# Patient Record
Sex: Female | Born: 1990 | Race: Black or African American | Hispanic: No | Marital: Single | Smoking: Never smoker
Health system: Southern US, Community
[De-identification: ages and names within clinical notes are randomized; demographics above are authoritative.]

## PROBLEM LIST (undated history)

## (undated) DIAGNOSIS — Z8619 Personal history of other infectious and parasitic diseases: Secondary | ICD-10-CM

## (undated) DIAGNOSIS — F329 Major depressive disorder, single episode, unspecified: Secondary | ICD-10-CM

## (undated) DIAGNOSIS — M419 Scoliosis, unspecified: Secondary | ICD-10-CM

## (undated) DIAGNOSIS — F32A Depression, unspecified: Secondary | ICD-10-CM

## (undated) DIAGNOSIS — E0590 Thyrotoxicosis, unspecified without thyrotoxic crisis or storm: Secondary | ICD-10-CM

## (undated) DIAGNOSIS — F419 Anxiety disorder, unspecified: Secondary | ICD-10-CM

## (undated) HISTORY — DX: Anxiety disorder, unspecified: F41.9

## (undated) HISTORY — DX: Major depressive disorder, single episode, unspecified: F32.9

## (undated) HISTORY — DX: Scoliosis, unspecified: M41.9

## (undated) HISTORY — DX: Depression, unspecified: F32.A

## (undated) HISTORY — DX: Personal history of other infectious and parasitic diseases: Z86.19

---
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
## 2009-07-07 ENCOUNTER — Ambulatory Visit: Payer: Self-pay | Admitting: Internal Medicine

## 2009-07-07 DIAGNOSIS — F4321 Adjustment disorder with depressed mood: Secondary | ICD-10-CM | POA: Insufficient documentation

## 2009-07-07 DIAGNOSIS — M4120 Other idiopathic scoliosis, site unspecified: Secondary | ICD-10-CM | POA: Insufficient documentation

## 2009-07-07 DIAGNOSIS — J45909 Unspecified asthma, uncomplicated: Secondary | ICD-10-CM | POA: Insufficient documentation

## 2009-10-27 ENCOUNTER — Ambulatory Visit: Payer: Self-pay | Admitting: Licensed Clinical Social Worker

## 2009-11-05 ENCOUNTER — Ambulatory Visit: Payer: Self-pay | Admitting: Licensed Clinical Social Worker

## 2009-11-05 ENCOUNTER — Ambulatory Visit: Payer: Self-pay | Admitting: Internal Medicine

## 2009-11-05 LAB — CONVERTED CEMR LAB
AST: 21 units/L (ref 0–37)
Albumin: 4.3 g/dL (ref 3.5–5.2)
BUN: 7 mg/dL (ref 6–23)
Basophils Absolute: 0 10*3/uL (ref 0.0–0.1)
Bilirubin Urine: NEGATIVE
CO2: 27 meq/L (ref 19–32)
Chloride: 108 meq/L (ref 96–112)
Cholesterol: 178 mg/dL (ref 0–200)
Eosinophils Absolute: 0.4 10*3/uL (ref 0.0–0.7)
Glucose, Bld: 80 mg/dL (ref 70–99)
Glucose, Urine, Semiquant: NEGATIVE
HCT: 35.8 % — ABNORMAL LOW (ref 36.0–46.0)
Hemoglobin: 11.8 g/dL — ABNORMAL LOW (ref 12.0–15.0)
Lymphs Abs: 2 10*3/uL (ref 0.7–4.0)
MCHC: 32.9 g/dL (ref 30.0–36.0)
MCV: 88.6 fL (ref 78.0–100.0)
Monocytes Absolute: 0.5 10*3/uL (ref 0.1–1.0)
Monocytes Relative: 10.6 % (ref 3.0–12.0)
Neutro Abs: 2 10*3/uL (ref 1.4–7.7)
Platelets: 270 10*3/uL (ref 150.0–400.0)
Potassium: 3.2 meq/L — ABNORMAL LOW (ref 3.5–5.1)
Protein, U semiquant: NEGATIVE
RDW: 13.4 % (ref 11.5–14.6)
Sodium: 139 meq/L (ref 135–145)
Specific Gravity, Urine: 1.025
TSH: 2.32 microintl units/mL (ref 0.35–5.50)
Total Bilirubin: 0.4 mg/dL (ref 0.3–1.2)
VLDL: 13.2 mg/dL (ref 0.0–40.0)
WBC Urine, dipstick: NEGATIVE
pH: 6

## 2009-11-11 ENCOUNTER — Ambulatory Visit: Payer: Self-pay | Admitting: Internal Medicine

## 2009-11-11 DIAGNOSIS — D649 Anemia, unspecified: Secondary | ICD-10-CM

## 2009-11-11 DIAGNOSIS — F4323 Adjustment disorder with mixed anxiety and depressed mood: Secondary | ICD-10-CM

## 2009-11-11 DIAGNOSIS — E876 Hypokalemia: Secondary | ICD-10-CM

## 2009-11-12 ENCOUNTER — Ambulatory Visit: Payer: Self-pay | Admitting: Licensed Clinical Social Worker

## 2009-12-10 ENCOUNTER — Ambulatory Visit: Payer: Self-pay | Admitting: Internal Medicine

## 2009-12-10 DIAGNOSIS — K5289 Other specified noninfective gastroenteritis and colitis: Secondary | ICD-10-CM | POA: Insufficient documentation

## 2009-12-11 LAB — CONVERTED CEMR LAB
Basophils Relative: 1.2 % (ref 0.0–3.0)
Chloride: 108 meq/L (ref 96–112)
Creatinine, Ser: 0.8 mg/dL (ref 0.4–1.2)
Eosinophils Relative: 7 % — ABNORMAL HIGH (ref 0.0–5.0)
Folate: >20 ng/mL
Hemoglobin: 12.4 g/dL (ref 12.0–15.0)
Iron: 102 ug/dL (ref 42–145)
Lymphocytes Relative: 31.5 % (ref 12.0–46.0)
MCV: 87.8 fL (ref 78.0–100.0)
Neutro Abs: 2.3 10*3/uL (ref 1.4–7.7)
Neutrophils Relative %: 49.9 % (ref 43.0–77.0)
RBC: 4.26 M/uL (ref 3.87–5.11)
Sodium: 142 meq/L (ref 135–145)
Transferrin: 247.4 mg/dL (ref 212.0–360.0)
Vitamin B-12: 404 pg/mL (ref 211–911)
WBC: 4.6 10*3/uL (ref 4.5–10.5)

## 2009-12-31 ENCOUNTER — Ambulatory Visit: Payer: Self-pay | Admitting: Licensed Clinical Social Worker

## 2010-01-01 ENCOUNTER — Ambulatory Visit: Payer: Self-pay | Admitting: Internal Medicine

## 2010-02-02 ENCOUNTER — Ambulatory Visit: Payer: Self-pay | Admitting: Internal Medicine

## 2010-03-11 ENCOUNTER — Ambulatory Visit: Payer: Self-pay | Admitting: Internal Medicine

## 2010-07-27 ENCOUNTER — Ambulatory Visit: Payer: Self-pay | Admitting: Licensed Clinical Social Worker

## 2010-08-02 ENCOUNTER — Encounter: Payer: Self-pay | Admitting: Internal Medicine

## 2010-10-05 NOTE — Assessment & Plan Note (Signed)
Summary: 5 wk rov/mm   Vital Signs:  Patient profile:   20 year old female Menstrual status:  regular Weight:      123 pounds BMI:     21.69 BP sitting:   92 / 62  (left arm) Cuff size:   regular  Vitals Entered By: Raechel Ache, RN (March 11, 2010 10:38 AM) CC: F/u on meds.   History of Present Illness: Bethany Jefferson  comes in today  for follow up of medsications.  Since last visit no change in  health status .No recent panic attacks .    Traveled to [place]  recently and  in comfort better.   Decided to not try the benzo because read about it and doesnt want to try it.  Still takign lexapro .  Still seeing  counselor but missed last appt because was out of town but actually some better.    Unsure how she will do  when  school starts.   Preventive Screening-Counseling & Management  Alcohol-Tobacco     Alcohol drinks/day: 0     Smoking Status: never  Allergies: No Known Drug Allergies  Past History:  Past medical, surgical, family and social histories (including risk factors) reviewed for relevance to current acute and chronic problems.  Past Medical History: Reviewed history from 02/02/2010 and no changes required. asthma as a child    quiescent.  Had varicella  disease  Spinal surgery fusion/ rod scoliosis  Past Surgical History: Reviewed history from 07/07/2009 and no changes required. Spinal Fusion 2002-Dr. [facility].  Family History: Reviewed history from 07/07/2009 and no changes required. Father: High Cholesterol, Graves Mother: Genella Rife Siblings: Sister-Crohn's   Social History: Reviewed history from 11/11/2009 and no changes required. Single  hh of 4  Never Smoked Alcohol use-no Regular exercise-no Did one year of [employer] . [place] since summer.   [employer]   taking 2 courses so far  Mom a nurse at [employer] No current partners  Review of Systems  The patient denies anorexia, fever, chest pain, syncope, difficulty walking, and  angioedema.    Physical Exam  General:  alert, well-developed, and well-nourished.   Neck:  No deformities, masses, or tenderness noted. Lungs:  normal respiratory effort and no intercostal retractions.   Psych:  Oriented X3, good eye contact, and not depressed appearing.  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   Problem # 3:  ANEMIA, MILD (ICD-285.9) declined hg check today and had been better  so ok to stick with MVI   Complete Medication List: 1)  Lexapro 20 Mg Tabs (Escitalopram oxalate) .Marland Kitchen.. 1 by mouth once daily 2)  Multivitamins Tabs (Multiple vitamin)  Patient Instructions: 1)  Get appt with [name]   2)  for now stay on the lexapro same dose  3)  Consider  changing to a medication like effexor for your anxiety.  4)  ROV  in one month or after seeing [name].  5)  Continue on vvitamins iron,

## 2010-10-05 NOTE — Assessment & Plan Note (Signed)
Summary: 3 wks rov/mm   Vital Signs:  Patient profile:   20 year old female Menstrual status:  regular LMP:     12/14/2009 Weight:      119 pounds Pulse rate:   60 / minute BP sitting:   100 / 60  (left arm) Cuff size:   regular  Vitals Entered By: Romualdo Bolk, CMA Duncan Dull) (January 01, 2010 2:03 PM) CC: Follow-up visit- Pt states that she just started a MVI with Iron on 4/28. LMP (date): 12/14/2009 LMP - Character: normal Menarche (age onset years): 10   Menses interval (days): 28 Menstrual flow (days): 5-6 Enter LMP: 12/14/2009   History of Present Illness: Bethany Jefferson comesin comes in today  for  above  for follow up of a few issues  Moods and anxiety: has seen [name] who siad she may have been having panic attacks . With increase to 15 mg of lexapro   anxiety some  better .   taking at night and makes her tired.    ha next counseling s appt    In [place]y 11  .    has uri also for about a week  congestion no fever. just started taking MVi  .   Preventive Screening-Counseling & Management  Alcohol-Tobacco     Alcohol drinks/day: 0     Smoking Status: never  Caffeine-Diet-Exercise     Caffeine use/day: less than 1     Does Patient Exercise: no  Current Medications (verified): 1)  Lexapro 10 Mg Tabs (Escitalopram Oxalate) .Marland Kitchen.. 1 and 1/2  By Mouth Once Daily 2)  Mvi With Fe  Allergies (verified): No Known Drug Allergies  Past History:  Past medical, surgical, family and social histories (including risk factors) reviewed for relevance to current acute and chronic problems.  Past Medical History: Reviewed history from 07/07/2009 and no changes required. asthma as a child    quiescent.  Had varicella  disease   Past Surgical History: Reviewed history from 07/07/2009 and no changes required. Spinal Fusion 2002-Dr. [facility]  Past History:  Care Management: Psychology  MSW: [name]- Counselor  Family History: Reviewed history  from 07/07/2009 and no changes required. Father: High Cholesterol, Graves Mother: Genella Rife Siblings: Sister-Crohn's   Social History: Reviewed history from 11/11/2009 and no changes required. Single  hh of 4  Never Smoked Alcohol use-no Regular exercise-no Did one year of [employer] . [place] since summer.   [employer]   taking 2 courses so far  Mom a nurse at [employer] No current partners  Review of Systems  The patient denies anorexia, chest pain, syncope, dyspnea on exertion, and enlarged lymph nodes.    Physical Exam  General:  alert, well-developed, well-nourished, and well-hydrated.   Nose:  mild congestion.  Psych:  Oriented X3, good eye contact, not depressed appearing, and slightly anxious.     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    rec  trial increase dose and add benzo as needed  Discussed risk benefit  .    Problem # 2:  HYPOKALEMIA, MILD (ICD-276.8) Assessment: Improved  Problem # 3:  ANEMIA, MILD (ICD-285.9) Assessment: Improved take MVI   Complete Medication List: 1)  Lexapro 10 Mg Tabs (Escitalopram oxalate) .... Take 2 by mouth once daily 2)  Mvi With Fe  3)  Alprazolam 0.25 Mg Tabs (Alprazolam) .Marland Kitchen.. 1 by mouth three  times a day as needed  panic attack 4)  Lexapro 20 Mg Tabs (Escitalopram oxalate) .Marland Kitchen.. 1 by mouth once daily  Patient Instructions: 1)  increase  lexapro to 20 mg   ( 2  10 mg per day)     2)  Can use xanax  as needed for panic attack 3)  Multivitamin with iron  for now Your labs  are better .  4)  return office visit in 1 month or as needed. Prescriptions: LEXAPRO 20 MG TABS (ESCITALOPRAM OXALATE) 1 by mouth once daily  #30 x 2   Entered and Authorized by:   Madelin Headings MD   Signed by:   Madelin Headings MD on 01/01/2010   Method used:   Print then Give to Patient   RxID:   [id number] ALPRAZOLAM 0.25 MG TABS (ALPRAZOLAM) 1 by mouth  three times a day as needed  panic attack  #24 x o   Entered and Authorized by:   Madelin Headings MD   Signed by:   Madelin Headings MD on 01/01/2010   Method used:   Print then Give to Patient   RxID:   [id number]

## 2010-10-05 NOTE — Assessment & Plan Note (Signed)
Summary: cpx/cjr [facility] BMP/NJR   Vital Signs:  Patient profile:   20 year old female Menstrual status:  regular LMP:     11/05/2009 Height:      63.25 inches Weight:      119 pounds Pulse rate:   66 / minute BP sitting:   100 / 60  (right arm) Cuff size:   regular  Vitals Entered By: Romualdo Bolk, CMA (AAMA) (November 11, 2009 1:02 PM) CC: CPX LMP (date): 11/05/2009 LMP - Character: normal Menarche (age onset years): 10   Menses interval (days): 28 Menstrual flow (days): 5-6 Enter LMP: 11/05/2009   History of Present Illness: Bethany Jefferson comesin today for   preventive visit . Since last visit no change in health but says deprssion and anxiety still present.  Recently began counseling seeing susan bond x 2 so far.  ? if med would help . NO suicidal but has though about it.   going to school [employer] 2 classes .  living at home. Some anxiety that keeps her from  meeting new people.  No panic attacks .  Sleeps poorly before some  things she has to do.  No bleeding except normal periods sometimes heavy.  no blood donation.  on no diuretics or supplements.     Preventive Care Screening  Prior Values:    Last Tetanus Booster:  Historical (09/06/2007)   Preventive Screening-Counseling & Management  Alcohol-Tobacco     Alcohol drinks/day: 0     Smoking Status: never  Caffeine-Diet-Exercise     Caffeine use/day: less than 1     Does Patient Exercise: no  Hep-HIV-STD-Contraception     Dental Visit-last 6 months yes  Safety-Violence-Falls     Seat Belt Use: yes     Firearms in the Home: no firearms in the home     Smoke Detectors: yes  Current Medications (verified): 1)  None  Allergies (verified): No Known Drug Allergies  Past History:  Past medical, surgical, family and social histories (including risk factors) reviewed, and no changes noted (except as noted below).  Past Medical History: Reviewed history from 07/07/2009 and no changes required. asthma as a  child    quiescent.  Had varicella  disease   Past Surgical History: Reviewed history from 07/07/2009 and no changes required. Spinal Fusion 2002-Dr. [facility].  Past History:  Care Management: Psychology  MSW: [name]- Counselor  Family History: Reviewed history from 07/07/2009 and no changes required. Father: High Cholesterol, Graves Mother: Genella Rife Siblings: Sister-Crohn's   Social History: Reviewed history from 07/07/2009 and no changes required. Single  hh of 4  Never Smoked Alcohol use-no Regular exercise-no Did one year of [employer] . [place] since summer.   [employer]   taking 2 courses so far  Mom a nurse at [employer] No current partners  Dental Care w/in 6 mos.:  yes  Review of Systems  The patient denies anorexia, fever, weight loss, vision loss, decreased hearing, hoarseness, chest pain, syncope, dyspnea on exertion, peripheral edema, prolonged cough, headaches, hemoptysis, abdominal pain, melena, hematochezia, severe indigestion/heartburn, hematuria, incontinence, genital sores, muscle weakness, suspicious skin lesions, transient blindness, difficulty walking, unusual weight change, abnormal bleeding, enlarged lymph nodes, angioedema, and breast masses.   Physical Exam General Appearance: well developed, well nourished, no acute distress Eyes: conjunctiva and lids normal, PERRLA, EOMI, WNL Ears, Nose, Mouth, Throat: TM clear, nares clear, oral exam WNL Neck: supple, no lymphadenopathy, no thyromegaly, no JVD Respiratory: clear to  auscultation and percussion, respiratory effort normal Cardiovascular: regular rate and rhythm, S1-S2, no murmur, rub or gallop, no bruits, peripheral pulses normal and symmetric, no cyanosis, clubbing, edema or varicosities Chest: no scars, masses, tenderness; no asymmetry, skin changes, nipple discharge   Gastrointestinal: soft, non-tender; no hepatosplenomegaly, masses; active bowel sounds all quadrants,  Genitourinary:  nl exsternal Lymphatic: no cervical, axillary or inguinal adenopathy Musculoskeletal: gait normal, muscle tone and strength WNL, no joint swelling, effusions, discoloration, crepitus well healed scar from scoliosis surgery.  Skin: clear, good turgor, color WNL, no rashes, lesions, or ulcerations Neurologic: normal mental status, normal reflexes, normal strength, sensation, and motion Psychiatric: alert; oriented to person, place and time Other Exam:  labs nl except K 3.2 and hg 11.8   UA was on menses     Impression & Recommendations:  Problem # 1:  HEALTH MAINTENANCE EXAM, ADULT (ICD-V70.0) counseled   . healthy lifestyle .  labs good except mild anemia .  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 in counseling  dis adding meds    and beagan lexapro today with samples and will have follow up appts.  Discussed risk benefit    and nuisuance se .  Problem # 3:  ANEMIA, MILD (ICD-285.9) prob iron related and can add vitamins and follow   Problem # 4:  HYPOKALEMIA, MILD (ICD-276.8) ? if lab issue  no other obv reason for this   consider recheck at follow up . HO on K foods   Complete Medication List: 1)  Lexapro 10 Mg Tabs (Escitalopram oxalate) .... Take 1/2 by mouth once daily for 1 week then increase to 1 by mouth once daily  Patient Instructions: 1)  Iincrease  potassium rich foods and avoid salt and sodium foods. 2)  Take Multivitamin with iron   to help   with your borderline anemia. 3)  Begin Lexapro 5 mg per day  for a week and then increase to 10 mg per day 4)  Return office visit in 3 weeks or so . 5)  Call in meantime with ?s  or concerns.

## 2010-10-05 NOTE — Medication Information (Signed)
Summary: Drug Utilization Review for Lexapro  Drug Utilization Review for Lexapro   Imported By: Maryln Gottron 08/06/2010 13:29:50  _____________________________________________________________________  External Attachment:    Type:   Image     Comment:   External Document

## 2010-10-05 NOTE — Assessment & Plan Note (Signed)
Summary: 1 mo rov/mm   Vital Signs:  Patient profile:   20 year old female Menstrual status:  regular LMP:     01/03/2010 Weight:      120 pounds Pulse rate:   78 / minute Pulse (ortho):   72 / minute BP sitting:   100 / 70  (left arm) BP standing:   102 / 62 Cuff size:   regular  Vitals Entered By: Romualdo Bolk, CMA (AAMA) (Feb 02, 2010 1:52 PM)  Serial Vital Signs/Assessments:  Time      Position  BP       Pulse  Resp  Temp     By 2:03 PM   Lying RA  110/60   78                    Shannon S Cranford, CMA (AAMA) 2:03 PM   Sitting   100/60   80                    Shannon S Cranford, CMA (AAMA) 2:03 PM   Standing  102/62   72                    Shannon S Cranford, CMA (AAMA)  CC: follow-up visit- Pt is still taking a regular mvI no iron at this time. Pt is taking 2 of the 10mg  of lexapro and it is working okay. Pt states that she felt lightheaded on 5/27 and she was also vomiting. Pt states that she looked online about this and discuss this with some people. She thinks it due to the lexapro. LMP (date): 01/03/2010 LMP - Character: normal Menarche (age onset years): 10   Menses interval (days): 28 Menstrual flow (days): 5-6 Enter LMP: 01/03/2010   History of Present Illness: Bethany Jefferson comes in today   for follow up of meds and anxiety  has been  on 20 mg of lexapro and No panic attacks   decided not to get the benzo filled   ..   some days  is ok with anxiety  and others  very worried.    some  good days.     Seeing  Darl Pikes and   missed appt ..    because of   car trouble.   5 days ago had onset of dizziness nausea and vomiting  without fever or abd pain ha    . Currently is now fine with this.  ? about  prolylaxis with dental work with her back surgery. had been on this in the past but unsure if needs this now. No hx of infection. NO currrent ortho. Released from care ( [place])  No bleeding.    Preventive Screening-Counseling & Management  Alcohol-Tobacco   Alcohol drinks/day: 0     Smoking Status: never  Caffeine-Diet-Exercise     Caffeine use/day: less than 1     Does Patient Exercise: no  Current Medications (verified): 1)  Alprazolam 0.25 Mg Tabs (Alprazolam) .Marland Kitchen.. 1 By Mouth Three Times A Day As Needed  Panic Attack 2)  Lexapro 20 Mg Tabs (Escitalopram Oxalate) .Marland Kitchen.. 1 By Mouth Once Daily 3)  Multivitamins   Tabs (Multiple Vitamin)  Allergies (verified): No Known Drug Allergies  Past History:  Past medical, surgical, family and social histories (including risk factors) reviewed, and no changes noted (except as noted below).  Past Medical History: asthma as a child    quiescent.  Had varicella  disease  Spinal surgery fusion/ rod scoliosis  Past Surgical History: Reviewed history from 07/07/2009 and no changes required. Spinal Fusion 2002-[place]. [facility]  Past History:  Care Management: Psychology  MSW: [name]- Counselor  Family History: Reviewed history from 07/07/2009 and no changes required. Father: High Cholesterol, Graves Mother: Genella Rife Siblings: Sister-Crohn's   Social History: Reviewed history from 11/11/2009 and no changes required. Single  hh of 4  Never Smoked Alcohol use-no Regular exercise-no Did one year of [employer] . [place].   [employer]   taking 2 courses so far  Mom a nurse at [employer] No current partners  Review of Systems  The patient denies anorexia, fever, weight loss, weight gain, vision loss, decreased hearing, prolonged cough, abdominal pain, muscle weakness, transient blindness, difficulty walking, abnormal bleeding, enlarged lymph nodes, and angioedema.    Physical Exam  General:  Well-developed,well-nourished,in no acute distress; alert,appropriate and cooperative throughout examination Head:  normocephalic and atraumatic.   Eyes:  vision grossly intact, pupils equal, and pupils round.  glasses  Ears:  R ear normal and L ear normal.  no external  deformities.   Nose:  no external deformity and no external erythema.   Mouth:  pharynx pink and moist.   Neck:  No deformities, masses, or tenderness noted. Lungs:  Normal respiratory effort, chest expands symmetrically. Lungs are clear to auscultation, no crackles or wheezes. Heart:  Normal rate and regular rhythm. S1 and S2 normal without gallop, murmur, click, rub or other extra sounds. Abdomen:  Bowel sounds positive,abdomen soft and non-tender without masses, organomegaly or  noted. Pulses:  pulses intact without delay   Extremities:  no clubbing cyanosis or edema  Neurologic:  non focal  Skin:  turgor normal, color normal, no ecchymoses, and no petechiae.   Cervical Nodes:  No lymphadenopathy noted Psych:  Oriented X3, good eye contact, and not depressed appearing.  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 will follow and have her continue counseling.  Problem # 2:  DIZZINESS EPISODE (ICD-780.4) sound like an acute episode and not related to med as she was on this for at least a week before onset and now is better. nl exam and va.  will folllow and recheck if recurrs.   Problem # 3:  SCOLIOSIS (ICD-737.30) Assessment: Comment Only  Problem # 4:  ANEMIA, MILD (ICD-285.9) better  last time consider rechecka t follow up   Complete Medication List: 1)  Alprazolam 0.25 Mg Tabs (Alprazolam) .Marland Kitchen.. 1 by mouth three times a day as needed  panic attack 2)  Lexapro 20 Mg Tabs (Escitalopram oxalate) .Marland Kitchen.. 1 by mouth once daily 3)  Multivitamins Tabs (Multiple vitamin)  Patient Instructions: 1)  no need for antibioitc for routine cleaning . 2)  if something more invasive ask again  about the antibioitc prophylaxis. 3)  I dont think the lexapro caused the dizzy vomiting episode  and stay on 20 mg for now    and get another appt with [name].  4)  return office visit in 4-5 weeks .

## 2010-10-05 NOTE — Assessment & Plan Note (Signed)
Summary: 3 WK ROV // RS   Vital Signs:  Patient profile:   20 year old female Menstrual status:  regular LMP:     12/01/2009 Weight:      116 pounds Pulse rate:   66 / minute BP sitting:   100 / 60  (right arm) Cuff size:   regular  Vitals Entered By: Bethany Jefferson, CMA (AAMA) (December 10, 2009 10:23 AM) CC: follow-up visit LMP (date): 12/01/2009 LMP - Character: normal Menarche (age onset years): 10   Menses interval (days): 28 Menstrual flow (days): 5-6 Enter LMP: 12/01/2009   History of Present Illness: Bethany Jefferson comesin for follow up of her med  for depression and anxiety . She istaking 1 by mouth once daily  of lexapro.  and   cant  tell a difference but sleep is better.    Hasnt seen Bethany Jefferson in a while. " needs to make an appt"  Has had a stomach bug for a day as in family with abd pain and diarrhea x 1 day but no fever and getting better and no vomiting.  No muscle cramps  noexcess bleeding or bruising.  Preventive Screening-Counseling & Management  Alcohol-Tobacco     Alcohol drinks/day: 0     Smoking Status: never  Caffeine-Diet-Exercise     Caffeine use/day: less than 1     Does Patient Exercise: no  Current Medications (verified): 1)  Lexapro 10 Mg Tabs (Escitalopram Oxalate) .Marland Kitchen.. 1 By Mouth Once Daily  Allergies (verified): No Known Drug Allergies  Past History:  Past medical, surgical, family and social histories (including risk factors) reviewed, and no changes noted (except as noted below).  Past Medical History: Reviewed history from 07/07/2009 and no changes required. asthma as a child    quiescent.  Had varicella  disease   Past Surgical History: Reviewed history from 07/07/2009 and no changes required. Spinal Fusion 2002-Dr. [facility].  Past History:  Care Management: Psychology  MSW: Bethany Jefferson- Counselor  Family History: Reviewed history from 07/07/2009 and no changes required. Father: High Cholesterol,  Graves Mother: Bethany Jefferson Siblings: Sister-Crohn's   Social History: Reviewed history from 11/11/2009 and no changes required. Single  hh of 4  Never Smoked Alcohol use-no Regular exercise-no Did one year of [employer] . [place].   [employer]   taking 2 courses so far  Mom a nurse at [employer] No current partners  Review of Systems       The patient complains of anorexia.  The patient denies fever, weight gain, chest pain, syncope, melena, hematochezia, severe indigestion/heartburn, hematuria, incontinence, difficulty walking, unusual weight change, abnormal bleeding, enlarged lymph nodes, and angioedema.    Physical Exam  General:  alert, well-developed, and well-nourished.  looks uncomfortable Head:  normocephalic and atraumatic.   Eyes:  vision grossly intact and pupils equal.   Ears:  R ear normal and L ear normal.   Mouth:  pharynx pink and moist.   Neck:  No deformities, masses, or tenderness noted. Lungs:  Normal respiratory effort, chest expands symmetrically. Lungs are clear to auscultation, no crackles or wheezes. Heart:  Normal rate and regular rhythm. S1 and S2 normal without gallop, murmur, click, rub or other extra sounds. Abdomen:  Bowel sounds positive,abdomen soft and non-tender without masses, organomegaly or  noted. Pulses:  pulses intact without delay   Neurologic:  non focal  Skin:  turgor normal, color normal, no ecchymoses, and no petechiae.   Cervical Nodes:  No lymphadenopathy noted Psych:  Oriented X3, not anxious appearing, and poor eye contact.  somewhat depressed affect .  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) Assessment Unchanged  no sig change except sleep better   willlincrease dose slowly a nd have her make counsellng appt  Problem # 2:  GASTROENTERITIS, ACUTE (ICD-558.9)  mild with diarrhea prob viral ge   will fu if persistent and progressive    Problem # 3:   ANEMIA, MILD (ICD-285.9) recheck today with  iron studies  Orders: TLB-CBC Platelet - w/Differential (85025-CBCD) TLB-Ferritin (82728-FER) TLB-IBC Pnl (Iron/FE;Transferrin) (83550-IBC) TLB-B12 + Folate Pnl ([id number]_[id number]-Z30/QMV) Venipuncture (78469)  Problem # 4:  HYPOKALEMIA, MILD (ICD-276.8) recheck today  athough could be worse with diarrhea   denies supplements of diuretics  Orders: TLB-BMP (Basic Metabolic Panel-BMET) (80048-METABOL) Venipuncture (62952)  Complete Medication List: 1)  Lexapro 10 Mg Tabs (Escitalopram oxalate) .Marland Kitchen.. 1 and 1/2  by mouth once daily  Patient Instructions: 1)  increase lexapro  to 15 mg per day   ( after the stomach situation is better [facility] to take at night  2)  Make appt with Bethany Jefferson.  3)  You will be informed of lab results when available.  4)  return office visit in 3-4 weeks or as needed.

## 2010-12-16 ENCOUNTER — Encounter: Payer: Self-pay | Admitting: Internal Medicine

## 2010-12-21 ENCOUNTER — Ambulatory Visit (INDEPENDENT_AMBULATORY_CARE_PROVIDER_SITE_OTHER): Admitting: Internal Medicine

## 2010-12-21 ENCOUNTER — Encounter: Payer: Self-pay | Admitting: Internal Medicine

## 2010-12-21 VITALS — BP 100/60 | HR 72 | Temp 98.2°F | Wt 124.0 lb

## 2010-12-21 DIAGNOSIS — M25562 Pain in left knee: Secondary | ICD-10-CM

## 2010-12-21 DIAGNOSIS — M4120 Other idiopathic scoliosis, site unspecified: Secondary | ICD-10-CM

## 2010-12-21 DIAGNOSIS — M25569 Pain in unspecified knee: Secondary | ICD-10-CM

## 2010-12-21 NOTE — Progress Notes (Signed)
  Subjective:    Patient ID: Bethany Jefferson, female    DOB: 1991-06-08, 20 y.o.   MRN: [id number]  HPI Patient comes in today because she is having an ongoing problem with her left knee. She's always had since she was younger and had her scoliosis surgery some occasional left knee pain. However since the fall she has noticed it has been more of a problem every day. Most recently she has been trying to ramp up her exercise for health reasons... working out for a few weeks for 30 min weights.  and then run 30 minutes   Mostly elliptical and treadmil.  Hurts off an on. Now worse  And gets some sx almost every day.     Describes it as an ache or pain in the front of the knee but sometimes to the side sometimes it will lock even in the middle the night but she does not describe kneecap swelling.     Ever since her scoliosis surgery she was told that she had a functional leg length discrepancy he needs to have some kind of lift in her shoe but has stopped using them for a while and never went back. There is some question of that aggravating a left knee problem.  Past Medical History  Diagnosis Date  . Asthma     as a child quiescent  . History of varicella   . Scoliosis    Past Surgical History  Procedure Date  . Spinal fusion 2002    Dr. [facility], rod    reports that she has never smoked. She does not have any smokeless tobacco history on file. She reports that she drinks alcohol. She reports that she does not use illicit drugs. family history includes Crohn's disease in her sister; GER disease in her mother; Luiz Blare' disease in her father; Hyperlipidemia in her father; and Thyroid disease in her father. No Known Allergies    Review of Systems No fever chest pain shortness of breath bleeding.  Mood is somewhat better she's been off the Lexapro for a while because it didn't help. She stopped seeing a counselor because of the questions about payment and financial.  She is  attending school [employer] and is getting always this time feels good about that.  Has a depressed mood that may last one or 2 weeks. Currently is okay.    Objective:   Physical Exam Well-developed well-nourished in no acute distress Examination of the back shows well-healed scoliosis scar she is standing her left hip is below her right hip. Question Trendelenburg.     The feet have no deformity normal arch.  Left knee some tenderness in the infrapatellar area slight effusion question joint line tenderness stable knee negative drawer good range of motion negative apprehension sign.       Assessment & Plan:  Left knee pain   coming more problematic if she tries to ramp up her exercise question of a functional left leg discrepancy with her history of scoliosis surgery  . This possibly aggravating  I think she should see an orthopedist sports medicine experts. FOR  advice evaluation and an intervention with this problem in the meantime she can cross train and try exercise bike.   Anxiety depression reactive    somewhat better off medicine we discussed getting help not interested in medicine at this point she can call to discuss if needed

## 2010-12-21 NOTE — Patient Instructions (Signed)
Someone will call you about  A fereffal about your left knee.  Try exercise bike in the mean time   And can use cold pack after exercise

## 2010-12-22 ENCOUNTER — Encounter: Payer: Self-pay | Admitting: Internal Medicine

## 2010-12-22 DIAGNOSIS — M25562 Pain in left knee: Secondary | ICD-10-CM | POA: Insufficient documentation

## 2011-03-11 ENCOUNTER — Encounter: Payer: Self-pay | Admitting: Internal Medicine

## 2011-03-11 ENCOUNTER — Ambulatory Visit (INDEPENDENT_AMBULATORY_CARE_PROVIDER_SITE_OTHER): Admitting: Internal Medicine

## 2011-03-11 VITALS — BP 110/70 | HR 60 | Ht 62.75 in | Wt 122.0 lb

## 2011-03-11 DIAGNOSIS — M419 Scoliosis, unspecified: Secondary | ICD-10-CM

## 2011-03-11 DIAGNOSIS — Z0000 Encounter for general adult medical examination without abnormal findings: Secondary | ICD-10-CM

## 2011-03-11 DIAGNOSIS — F4323 Adjustment disorder with mixed anxiety and depressed mood: Secondary | ICD-10-CM

## 2011-03-11 DIAGNOSIS — M4120 Other idiopathic scoliosis, site unspecified: Secondary | ICD-10-CM

## 2011-03-11 LAB — TSH: TSH: 0.18 u[IU]/mL — ABNORMAL LOW (ref 0.35–5.50)

## 2011-03-11 LAB — CBC WITH DIFFERENTIAL/PLATELET
Basophils Absolute: 0 10*3/uL (ref 0.0–0.1)
Lymphocytes Relative: 34.8 % (ref 12.0–46.0)
Monocytes Relative: 10.9 % (ref 3.0–12.0)
Neutrophils Relative %: 47.7 % (ref 43.0–77.0)
Platelets: 248 10*3/uL (ref 150.0–400.0)
RDW: 14.9 % — ABNORMAL HIGH (ref 11.5–14.6)

## 2011-03-11 NOTE — Patient Instructions (Signed)
Will notify you  of labs when available. Make appt for pelvic pap before go of to school and recheck breast exam also at that time.  Continue healthy eating and exercise .

## 2011-03-11 NOTE — Progress Notes (Signed)
Subjective:    Patient ID: Bethany Jefferson, female    DOB: 1991/03/14, 20 y.o.   MRN: [id number]  HPI Patient comes in today for Preventive Health Care visit  No major change in health status since last visit .  MOOD: Better anxiety after public speaking course. Not on meds  .  pland to get counselor when gets to campus  Depression still an issues.  To get eye test. Wearing glasses Review of Systems ROS:  GEN/ HEENTNo fever, significant weight changes sweats headaches vision problems hearing changes, CV/ PULM; No chest pain shortness of breath cough, syncope,edema  change in exercise tolerance. GI /GU: No adominal pain, vomiting, change in bowel habits. No blood in the stool. No significant GU symptoms. SKIN/HEME: ,no acute skin rashes suspicious lesions or bleeding. No lymphadenopathy, nodules, masses.  NEURO/ PSYCH:  No neurologic signs such as weakness numbness No depression anxiety. IMM/ Allergy: No unusual infections.  Allergy .    Periods  Last   About  5-7 days ... REST of 12 system review negative seeHPI  Past Medical History  Diagnosis Date  . Asthma     as a child quiescent  . History of varicella   . Scoliosis     fusion rod  2002   Past Surgical History  Procedure Date  . Spinal fusion 2002    Dr. [facility], rod    reports that she has never smoked. She does not have any smokeless tobacco history on file. She reports that she drinks alcohol. She reports that she does not use illicit drugs. family history includes Crohn's disease in her sister; GER disease in her mother; Luiz Blare' disease in her father; Hyperlipidemia in her father; and Thyroid disease in her father. No Known Allergies      Objective:   Physical Exam Physical Exam: Vital signs reviewed WUJ:WJXB is a well-developed well-nourished alert cooperative  aa female who appears her stated age in no acute distress.  HEENT: normocephalic  traumatic , Eyes: PERRL EOM's full, conjunctiva  clear, Nares: paten,t no deformity discharge or tenderness., Ears: no deformity EAC's clear TMs with normal landmarks. Mouth: clear OP, no lesions, edema.  Moist mucous membranes. Dentition in adequate repair. NECK: supple without masses, thyromegaly or bruits. Breast: normal by inspection . No dimpling, discharge, , tenderness or discharge . Upper about 12 noon with 1-2 cm thickening bilateral left more than right no cysts  LN: no cervical axillary inguinal adenopathy CHEST/PULM:  Clear to auscultation and percussion breath sounds equal no wheeze , rales or rhonchi. No chest wall deformities or tenderness. CV: PMI is nondisplaced, S1 S2 no gallops, murmurs, rubs. Peripheral pulses are full without delay.No JVD .  ABDOMEN: Bowel sounds normal nontender  No guard or rebound, no hepato splenomegal no CVA tenderness.  No hernia. Extremtities:  No clubbing cyanosis or edema, no acute joint swelling or redness no focal atrophy well healed scoliosis scar  NEURO:  Oriented x3, cranial nerves 3-12 appear to be intact, no obvious focal weakness,gait within normal limits no abnormal reflexes or asymmetrical SKIN: No acute rashes normal turgor, color, no bruising or petechiae. PSYCH: Oriented, good eye contact, no obvious depression anxiety, cognition and judgment appear normal.     Assessment & Plan:  Preventive Health Care Counseled regarding healthy nutrition, exercise, sleep, injury prevention, calcium vit d and healthy weight .  On menses can come back for pap pelvic not curr sa. Mood  Better still down days  To set up with  help on campus when gets there. Counseled.

## 2011-03-12 ENCOUNTER — Encounter: Payer: Self-pay | Admitting: Internal Medicine

## 2011-03-12 DIAGNOSIS — M419 Scoliosis, unspecified: Secondary | ICD-10-CM | POA: Insufficient documentation

## 2011-03-12 LAB — HIV ANTIBODY (ROUTINE TESTING W REFLEX): HIV: NONREACTIVE

## 2011-03-17 ENCOUNTER — Telehealth: Payer: Self-pay | Admitting: *Deleted

## 2011-03-17 NOTE — Telephone Encounter (Signed)
Pt aware and appt made.

## 2011-03-17 NOTE — Telephone Encounter (Signed)
Left message to call back. Lab orders already in epic.

## 2011-03-17 NOTE — Telephone Encounter (Signed)
Message copied by Romualdo Bolk on Thu Mar 17, 2011 10:52 AM ------      Message from: [facility], [place] K      Created: Wed Mar 16, 2011  9:36 AM       Tell patient that her thyroid may be overactive . She is not anemic.        Please have her  Get. TSH, free T4 and free T3   . Dx abnormal thyroid test.  Dont need to fast. Keep her upcoming appt.

## 2011-03-21 ENCOUNTER — Other Ambulatory Visit (INDEPENDENT_AMBULATORY_CARE_PROVIDER_SITE_OTHER)

## 2011-03-21 DIAGNOSIS — R946 Abnormal results of thyroid function studies: Secondary | ICD-10-CM

## 2011-03-21 LAB — T3, FREE: T3, Free: 4.4 pg/mL — ABNORMAL HIGH (ref 2.3–4.2)

## 2011-03-22 ENCOUNTER — Telehealth: Payer: Self-pay | Admitting: *Deleted

## 2011-03-22 DIAGNOSIS — E0590 Thyrotoxicosis, unspecified without thyrotoxic crisis or storm: Secondary | ICD-10-CM

## 2011-03-22 NOTE — Telephone Encounter (Signed)
Left message for pt to call back  °

## 2011-03-22 NOTE — Telephone Encounter (Signed)
Appt is 7/31 at 4pm Dr. Everardo All [phone]

## 2011-03-22 NOTE — Telephone Encounter (Signed)
Message copied by Romualdo Bolk on Tue Mar 22, 2011  2:44 PM ------      Message from: [facility] [place], [place] K      Created: Mon Mar 21, 2011  5:09 PM       She is still hyper thyroid        Refer to  Endocrinology  asap  To get under  rx plan before  She leaves for college .

## 2011-03-23 NOTE — Telephone Encounter (Signed)
Left message to call back  

## 2011-03-24 NOTE — Telephone Encounter (Signed)
Pt aware of results 

## 2011-03-25 ENCOUNTER — Ambulatory Visit (INDEPENDENT_AMBULATORY_CARE_PROVIDER_SITE_OTHER): Admitting: Internal Medicine

## 2011-03-25 ENCOUNTER — Other Ambulatory Visit (HOSPITAL_COMMUNITY)
Admission: RE | Admit: 2011-03-25 | Discharge: 2011-03-25 | Disposition: A | Discharge status: Home / Self Care | Source: Ambulatory Visit | Attending: Internal Medicine | Admitting: Internal Medicine

## 2011-03-25 ENCOUNTER — Encounter: Payer: Self-pay | Admitting: Internal Medicine

## 2011-03-25 VITALS — BP 100/60 | HR 78 | Wt 121.0 lb

## 2011-03-25 DIAGNOSIS — Z124 Encounter for screening for malignant neoplasm of cervix: Secondary | ICD-10-CM

## 2011-03-25 DIAGNOSIS — Z01419 Encounter for gynecological examination (general) (routine) without abnormal findings: Secondary | ICD-10-CM

## 2011-03-25 DIAGNOSIS — E0590 Thyrotoxicosis, unspecified without thyrotoxic crisis or storm: Secondary | ICD-10-CM

## 2011-03-25 DIAGNOSIS — Z0000 Encounter for general adult medical examination without abnormal findings: Secondary | ICD-10-CM

## 2011-03-25 NOTE — Patient Instructions (Addendum)
Your pelvic exam is normal. inform patient pap normal. Keep a check on  Breast exam if getting  Increasing lump or problem but this may be normal for you.  Keep appt for  Endocrinology for hyperactive thyroid.

## 2011-03-25 NOTE — Progress Notes (Signed)
  Subjective:    Patient ID: Bethany Jefferson, female    DOB: 07/26/1991, 20 y.o.   MRN: [id number]  HPI Comes  In for pelvic pap  And fu of labs No major change in health status since last visit .  Ready to go to college. Has appt with endo  In next few weeks. Had a little of spotting . To check breast   Review of Systems No fever cp sob breast feel normal    Objective:   Physical Exam Breast ; symmetrical thickening  Upper breast but no discrete nodule noted     Pelvic: NL ext GU, labia clear without lesions or rash . Vagina no lesions .Cervix: clear  UTERUS: Neg CMT Adnexa:  clear no masses . PAP done small amt old blood in vault  Labs reviewed with patient.  Assessment & Plan:  Pap pelvic  Normal exam pap done  Hyperthyroid' pending eval  Disc  Expectant evaluation   Breast exam to follow  prob not significant changes.,

## 2011-03-29 ENCOUNTER — Telehealth: Payer: Self-pay | Admitting: Internal Medicine

## 2011-03-29 NOTE — Telephone Encounter (Signed)
Called cytology and told them that this is correct.

## 2011-03-29 NOTE — Telephone Encounter (Signed)
Pap was ordered for pt.with only testing for chlamydia was this all that needs to be tested?

## 2011-03-30 ENCOUNTER — Encounter: Payer: Self-pay | Admitting: *Deleted

## 2011-04-05 ENCOUNTER — Ambulatory Visit (INDEPENDENT_AMBULATORY_CARE_PROVIDER_SITE_OTHER): Admitting: Endocrinology

## 2011-04-05 ENCOUNTER — Encounter: Payer: Self-pay | Admitting: Endocrinology

## 2011-04-05 VITALS — BP 92/68 | HR 73 | Temp 97.4°F | Ht 62.5 in | Wt 120.0 lb

## 2011-04-05 DIAGNOSIS — E0590 Thyrotoxicosis, unspecified without thyrotoxic crisis or storm: Secondary | ICD-10-CM

## 2011-04-05 NOTE — Progress Notes (Signed)
  Subjective:    Patient ID: Bethany Jefferson, female    DOB: 1990/09/29, 20 y.o.   MRN: [id number]  HPI Pt recently had tft, due to pos fhx.  Upon hearing of the abnormal labs, she says in retrospect, she has sxs: few mos of depression, but no weakness of the arms or legs.  She has assoc anxiety.   Past Medical History  Diagnosis Date  . Asthma     as a child quiescent  . History of varicella   . Scoliosis     fusion rod  2002   Past Surgical History  Procedure Date  . Spinal fusion 2002    Dr. Elliot Dally [facility], rod   History   Social History  . Marital Status: Single    Spouse Name: N/A    Number of Children: N/A  . Years of Education: N/A   Occupational History  . Not on file.   Social History Main Topics  . Smoking status: Never Smoker   . Smokeless tobacco: Not on file  . Alcohol Use: Yes     socially  . Drug Use: No  . Sexually Active:    Other Topics Concern  . Not on file   Social History Narrative   SingleHH of 4Did one year at [employer] well  To transfer to    [place] .    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 MULTIPLE VITAMIN PO Take by mouth.          No Known Allergies  Family History  Problem Relation Age of Onset  . GER disease Mother   . Hyperlipidemia Father   . Graves' disease Father   . Thyroid disease Father   . Crohn's disease Sister    BP 92/68  Pulse 73  Temp(Src) 97.4 F (36.3 C) (Oral)  Ht 5' 2.5" (1.588 m)  Wt 120 lb (54.432 kg)  BMI 21.60 kg/m2  SpO2 98%  LMP 03/08/2011  Review of Systems denies weight loss, headache, hoarseness, double vision, palpitations, sob, polyuria, myalgias, excessive diaphoresis, numbness,tremor, hypoglycemia, easy bruising, and rhinorrhea.  She reports fatigue, and frequent bm's.   Objective:   Physical Exam VS: see vs page GEN: no distress HEAD: head: no  deformity eyes: no periorbital swelling, no proptosis external nose and ears are normal mouth: no lesion seen NECK: supple, thyroid is slightly and diffusely enlarged.  No nodule. CHEST WALL: no deformity CV: reg rate and rhythm, no murmur ABD: abdomen is soft, nontender.  no hepatosplenomegaly.  not distended.  no hernia MUSCULOSKELETAL: muscle bulk and strength are grossly normal.  no obvious joint swelling.  gait is normal and steady EXTEMITIES: no deformity.  no ulcer on the feet.  feet are of normal color and temp.  no edema PULSES: dorsalis pedis intact bilat.  no carotid bruit NEURO:  cn 2-12 grossly intact.   readily moves all 4's.  sensation is intact to touch on the feet SKIN:  Normal texture and temperature.  No rash or suspicious lesion is visible.   NODES:  None palpable at the neck PSYCH: alert, oriented x3.  Does not appear anxious nor depressed.   Lab Results  Component Value Date   TSH 0.07* 03/21/2011   Assessment & Plan:  Grave's dz, hereditary Hyperthyroidism, due to grave's dz Depression.  Uncertain if this is thyroid-related

## 2011-04-05 NOTE — Patient Instructions (Addendum)
let's check a thyroid "scan" (a special, but easy and painless type of thyroid x ray).  It works like this: you go to the x-ray department of the hospital to swallow a pill, which contains a miniscule amount of radiation.  You will not notice any symptoms from this.  You will go back to the x-ray department the next day, to lie down in front of a camera.  The results of this will be sent to me.  Then please call [phone] to hear your test results.  You will be prompted to enter the 9-digit "MRN" number that appears at the top left of this page, followed by #.  Then you will hear the message. Based on the results, i may advise you to take a treatment pill of radioactive iodine.  Although it is a larger amount of radiation, you will again notice no symptoms from this.  The pill is gone from your body in a few days (during which you should stay away from other people), but takes several months to work.  Therefore, please return here approximately 6-8 weeks after the treatment.  This treatment has been available for many years, and the only known side-effect is an underactive thyroid.  It is possible that i would eventually prescribe for you a thyroid hormone pill, which is very inexpensive.  You don't have to worry about side-effects of this thyroid hormone pill, because it is the same molecule your thyroid makes. In view of your medical condition, you should avoid pregnancy until we have decided it is safe

## 2011-04-19 ENCOUNTER — Encounter (HOSPITAL_COMMUNITY)
Admission: RE | Admit: 2011-04-19 | Discharge: 2011-04-19 | Disposition: A | Discharge status: Home / Self Care | Source: Ambulatory Visit | Attending: Endocrinology | Admitting: Endocrinology

## 2011-04-19 DIAGNOSIS — E0590 Thyrotoxicosis, unspecified without thyrotoxic crisis or storm: Secondary | ICD-10-CM

## 2011-04-20 ENCOUNTER — Encounter (HOSPITAL_COMMUNITY)
Admission: RE | Admit: 2011-04-20 | Discharge: 2011-04-20 | Disposition: A | Discharge status: Home / Self Care | Source: Ambulatory Visit | Attending: Endocrinology | Admitting: Endocrinology

## 2011-04-20 MED ORDER — SODIUM IODIDE I 131 CAPSULE
10.0000 | Freq: Once | INTRAVENOUS | Status: AC | PRN
  Administered 2011-04-19: 10 via ORAL

## 2011-04-20 MED ORDER — SODIUM PERTECHNETATE TC 99M INJECTION
10.0000 | Freq: Once | INTRAVENOUS | Status: AC | PRN
  Administered 2011-04-20: 10 via INTRAVENOUS

## 2011-04-23 ENCOUNTER — Telehealth: Payer: Self-pay | Admitting: Endocrinology

## 2011-04-23 DIAGNOSIS — E0590 Thyrotoxicosis, unspecified without thyrotoxic crisis or storm: Secondary | ICD-10-CM

## 2011-04-23 NOTE — Telephone Encounter (Signed)
i left message on phone tree i ordered i-131 rx.  Ret 6 weeks later. 

## 2011-05-03 ENCOUNTER — Ambulatory Visit (HOSPITAL_COMMUNITY)

## 2011-06-21 ENCOUNTER — Encounter (HOSPITAL_COMMUNITY): Payer: Self-pay

## 2011-06-21 ENCOUNTER — Encounter (HOSPITAL_COMMUNITY)
Admission: RE | Admit: 2011-06-21 | Discharge: 2011-06-21 | Disposition: A | Discharge status: Home / Self Care | Source: Ambulatory Visit | Attending: Endocrinology | Admitting: Endocrinology

## 2011-06-21 DIAGNOSIS — E0590 Thyrotoxicosis, unspecified without thyrotoxic crisis or storm: Secondary | ICD-10-CM | POA: Insufficient documentation

## 2011-06-21 HISTORY — DX: Thyrotoxicosis, unspecified without thyrotoxic crisis or storm: E05.90

## 2011-06-21 MED ORDER — SODIUM IODIDE I 131 CAPSULE
25.4000 | Freq: Once | INTRAVENOUS | Status: AC | PRN
  Administered 2011-06-21: 25.4 via ORAL

## 2011-08-22 ENCOUNTER — Ambulatory Visit (INDEPENDENT_AMBULATORY_CARE_PROVIDER_SITE_OTHER): Admitting: Family Medicine

## 2011-08-22 ENCOUNTER — Encounter: Payer: Self-pay | Admitting: Family Medicine

## 2011-08-22 VITALS — BP 100/60 | HR 97 | Temp 98.4°F | Wt 131.0 lb

## 2011-08-22 DIAGNOSIS — M79601 Pain in right arm: Secondary | ICD-10-CM

## 2011-08-22 DIAGNOSIS — M79609 Pain in unspecified limb: Secondary | ICD-10-CM

## 2011-08-22 DIAGNOSIS — M7710 Lateral epicondylitis, unspecified elbow: Secondary | ICD-10-CM

## 2011-08-22 MED ORDER — DICLOFENAC SODIUM 75 MG PO TBEC: 75.0000 mg | DELAYED_RELEASE_TABLET | Freq: Two times a day (BID) | ORAL | Status: DC

## 2011-08-22 NOTE — Progress Notes (Signed)
  Subjective:    Patient ID: Bethany Jefferson, female    DOB: [birth year], 20 y.o.   MRN: [id number]  HPI 20 year old [place] American female, nonsmoker, and 2 weeks of right arm pain. Denies any injury, were no history of injury or pain. Describes the pain as constant, worse with extension and lifting. Better no movement. She has tenderness in her right elbow. Has not taken any medication to help her symptoms. Patient is requesting an x-ray of her arm.   Review of Systems Negative except as stated above    Objective:   Physical Exam Constitutional alert and oriented in no acute distress Lungs: Clear to auscultation Cardiac: Regular rate and rhythm, no murmurs rubs or gallops Musculoskeletal: Tenderness to the right elbow and forearm with forced flexion and extension. Tenderness to palpation.  Neurological: Deep tendon reflexes are 2+ equal bilaterally. Pulses 2 out of 2 Skin: Warm and dry Psychiatric: Normal mood and affect       Assessment & Plan:  Assessment: Tennis elbow  Plan: Advised that I believe an x-ray was necessary. However she is adamant about getting one, therefore an x-ray was ordered her right arm. Both parents and 5 mg one tablet twice a day. Ice and heat to the affected area. Rest. Call if symptoms worsen or persist. Recheck as scheduled and sooner when necessary.

## 2011-08-22 NOTE — Patient Instructions (Signed)
Tennis Elbow Your caregiver has diagnosed you with a condition often referred to as "tennis elbow." This results from small tears or soreness (inflammation) at the start (origin) of the extensor muscles of the forearm. Although the condition is often called tennis or golfer's elbow, it is caused by any repetitive action performed by your elbow. HOME CARE INSTRUCTIONS  If the condition has been short lived, rest may be the only treatment required. Using your opposite hand or arm to perform the task may help. Even changing your grip may help rest the extremity. These may even prevent the condition from recurring.   Longer standing problems, however, will often be relieved faster by:   Using anti-inflammatory agents.   Applying ice packs for 30 minutes at the end of the working day, at bed time, or when activities are finished.   Your caregiver may also have you wear a splint or sling. This will allow the inflamed tendon to heal.  At times, steroid injections aided with a local anesthetic will be required along with splinting for 1 to 2 weeks. Two to three steroid injections will often solve the problem. In some long standing cases, the inflamed tendon does not respond to conservative (non-surgical) therapy. Then surgery may be required to repair it. MAKE SURE YOU:   Understand these instructions.   Will watch your condition.   Will get help right away if you are not doing well or get worse.  Document Released: 08/22/2005 Document Revised: 05/04/2011 Document Reviewed: 04/09/2008 Exit[employer] Patient Information 2012 [employer]. 

## 2011-08-24 ENCOUNTER — Encounter: Payer: Self-pay | Admitting: Endocrinology

## 2011-08-24 ENCOUNTER — Ambulatory Visit (INDEPENDENT_AMBULATORY_CARE_PROVIDER_SITE_OTHER): Admitting: Endocrinology

## 2011-08-24 ENCOUNTER — Other Ambulatory Visit (INDEPENDENT_AMBULATORY_CARE_PROVIDER_SITE_OTHER)

## 2011-08-24 VITALS — BP 98/68 | HR 71 | Temp 97.8°F | Ht 62.5 in | Wt 128.6 lb

## 2011-08-24 DIAGNOSIS — E0590 Thyrotoxicosis, unspecified without thyrotoxic crisis or storm: Secondary | ICD-10-CM

## 2011-08-24 NOTE — Progress Notes (Signed)
  Subjective:    Patient ID: Bethany Jefferson, female    DOB: 1991/01/18, 20 y.o.   MRN: [id number]  HPI Pt is 4 mos s/p i-131 rx, for hyperthyroidism due to grave's dz.  pt states she feels well in general, except for weight gain. Past Medical History  Diagnosis Date  . Asthma     as a child quiescent  . History of varicella   . Scoliosis     fusion rod  2002  . Hyperthyroidism     Past Surgical History  Procedure Date  . Spinal fusion 2002    Dr. Elliot Dally [facility], rod    History   Social History  . Marital Status: Single    Spouse Name: N/A    Number of Children: N/A  . Years of Education: N/A   Occupational History  . Not on file.   Social History Main Topics  . Smoking status: Never Smoker   . Smokeless tobacco: Not on file  . Alcohol Use: Yes     socially  . Drug Use: No  . Sexually Active:    Other Topics Concern  . Not on file   Social History Narrative   SingleHH of 4Did one year at [employer] well  To transfer to    [place] .    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 diclofenac (VOLTAREN) 75 MG EC tablet Take 1 tablet (75 mg total) by mouth 2 (two) times daily with a meal.  60 tablet  2  . MULTIPLE VITAMIN PO Take by mouth.          No Known Allergies  Family History  Problem Relation Age of Onset  . GER disease Mother   . Hyperlipidemia Father   . Graves' disease Father   . Thyroid disease Father   . Crohn's disease Sister    BP 98/68  Pulse 71  Temp(Src) 97.8 F (36.6 C) (Oral)  Ht 5' 2.5" (1.588 m)  Wt 128 lb 9.6 oz (58.333 kg)  BMI 23.15 kg/m2  SpO2 98%  LMP 07/07/2011  Review of Systems She has depression and anxiety.      Objective:   Physical Exam VITAL SIGNS:  See vs page GENERAL: no distress NECK: There is no palpable thyroid enlargement.  No thyroid nodule is palpable.  No palpable  lymphadenopathy at the anterior neck.  Lab Results  Component Value Date   TSH 6.66* 08/24/2011      Assessment & Plan:  Post-i-131 hypothyroidism, new

## 2011-08-24 NOTE — Patient Instructions (Addendum)
blood tests are being requested for you today.  please call [phone] to hear your test results.  You will be prompted to enter the 9-digit "MRN" number that appears at the top left of this page, followed by #.  Then you will hear the message. Please come back for a follow-up appointment for 1 month.  Please make an appointment. (update: i left message on phone-tree:  Start synthroid.  i have sent a prescription to your pharmacy).

## 2011-08-26 MED ORDER — LEVOTHYROXINE SODIUM 100 MCG PO TABS: 100.0000 ug | ORAL_TABLET | Freq: Every day | ORAL | Status: DC

## 2011-09-09 ENCOUNTER — Ambulatory Visit: Admitting: Internal Medicine

## 2011-10-17 ENCOUNTER — Telehealth: Payer: Self-pay | Admitting: Endocrinology

## 2011-10-17 MED ORDER — LEVOTHYROXINE SODIUM 100 MCG PO TABS: 100.0000 ug | ORAL_TABLET | Freq: Every day | ORAL | Status: DC

## 2011-10-17 NOTE — Telephone Encounter (Signed)
Rx for Synthroid sent to [facility].

## 2011-10-17 NOTE — Telephone Encounter (Signed)
The pt called and is hoping to get a refill of her synthroid , Thanks!

## 2011-11-07 ENCOUNTER — Telehealth: Payer: Self-pay | Admitting: *Deleted

## 2011-11-07 DIAGNOSIS — E0590 Thyrotoxicosis, unspecified without thyrotoxic crisis or storm: Secondary | ICD-10-CM

## 2011-11-07 NOTE — Telephone Encounter (Signed)
i ordered

## 2011-11-07 NOTE — Telephone Encounter (Signed)
Pt wants to have thyroid labs done before upcoming appointment of the week of 3/25.

## 2011-11-07 NOTE — Telephone Encounter (Signed)
Pt informed labs in system and that she can come in for appointment.

## 2011-11-28 ENCOUNTER — Ambulatory Visit: Admitting: Endocrinology

## 2011-11-29 ENCOUNTER — Other Ambulatory Visit (INDEPENDENT_AMBULATORY_CARE_PROVIDER_SITE_OTHER)

## 2011-11-29 ENCOUNTER — Encounter: Payer: Self-pay | Admitting: Endocrinology

## 2011-11-29 ENCOUNTER — Ambulatory Visit (INDEPENDENT_AMBULATORY_CARE_PROVIDER_SITE_OTHER): Admitting: Endocrinology

## 2011-11-29 VITALS — BP 102/70 | HR 92 | Temp 97.4°F | Ht 62.5 in | Wt 128.8 lb

## 2011-11-29 DIAGNOSIS — E890 Postprocedural hypothyroidism: Secondary | ICD-10-CM

## 2011-11-29 NOTE — Progress Notes (Signed)
  Subjective:    Patient ID: Bethany Jefferson, female    DOB: [birth year], 21 y.o.   MRN: [id number]  HPI Pt is 5 mos s/p i-131 rx, for hyperthyroidism due to grave's dz.  pt states she feels well in general, except for fatigue.  She takes synthroid as rx'ed.  Past Medical History  Diagnosis Date  . Asthma     as a child quiescent  . History of varicella   . Scoliosis     fusion rod  2002  . Hyperthyroidism     Past Surgical History  Procedure Date  . Spinal fusion 2002    Dr. Elliot Dally [facility], rod    History   Social History  . Marital Status: Single    Spouse Name: N/A    Number of Children: N/A  . Years of Education: N/A   Occupational History  . Not on file.   Social History Main Topics  . Smoking status: Never Smoker   . Smokeless tobacco: Not on file  . Alcohol Use: Yes     socially  . Drug Use: No  . Sexually Active:    Other Topics Concern  . Not on file   Social History Narrative   SingleHH of 4Did one year at [employer] well  To transfer to    [place] .    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 levothyroxine (SYNTHROID, LEVOTHROID) 100 MCG tablet Take 1 tablet (100 mcg total) by mouth daily.  30 tablet  9    No Known Allergies  Family History  Problem Relation Age of Onset  . GER disease Mother   . Hyperlipidemia Father   . Graves' disease Father   . Thyroid disease Father   . Crohn's disease Sister     BP 102/70  Pulse 92  Temp(Src) 97.4 F (36.3 C) (Oral)  Ht 5' 2.5" (1.588 m)  Wt 128 lb 12.8 oz (58.423 kg)  BMI 23.18 kg/m2  SpO2 98%    Review of Systems She has some bowel urgency, but no diarrhea.     Objective:   Physical Exam VITAL SIGNS:  See vs page GENERAL: no distress NECK: There is no palpable thyroid enlargement.  No thyroid nodule is palpable.  No palpable lymphadenopathy at the anterior  neck.  Lab Results  Component Value Date   TSH 0.07* 11/29/2011      Assessment & Plan:  Post-i-131 hypothyroidism.  Slightly overreplaced for now, but her requirements will increase over the next 1-2 mos

## 2011-11-29 NOTE — Patient Instructions (Addendum)
blood tests are being requested for you today.  You will receive a letter with results. (see letter) 

## 2011-11-30 ENCOUNTER — Encounter: Payer: Self-pay | Admitting: Endocrinology

## 2011-12-01 ENCOUNTER — Telehealth: Payer: Self-pay | Admitting: *Deleted

## 2011-12-01 NOTE — Telephone Encounter (Signed)
Called pt to inform of TSH results. Pt informed of results (Letter mailed to pt).

## 2012-01-16 ENCOUNTER — Encounter: Payer: Self-pay | Admitting: Internal Medicine

## 2012-01-16 ENCOUNTER — Ambulatory Visit (INDEPENDENT_AMBULATORY_CARE_PROVIDER_SITE_OTHER): Admitting: Internal Medicine

## 2012-01-16 VITALS — BP 110/70 | HR 88 | Temp 98.3°F | Wt 131.0 lb

## 2012-01-16 DIAGNOSIS — E890 Postprocedural hypothyroidism: Secondary | ICD-10-CM

## 2012-01-16 DIAGNOSIS — F4321 Adjustment disorder with depressed mood: Secondary | ICD-10-CM

## 2012-01-16 DIAGNOSIS — Z79899 Other long term (current) drug therapy: Secondary | ICD-10-CM

## 2012-01-16 DIAGNOSIS — Z5189 Encounter for other specified aftercare: Secondary | ICD-10-CM

## 2012-01-16 DIAGNOSIS — F4323 Adjustment disorder with mixed anxiety and depressed mood: Secondary | ICD-10-CM

## 2012-01-16 MED ORDER — SERTRALINE HCL 100 MG PO TABS: 100.0000 mg | ORAL_TABLET | Freq: Every day | ORAL | Status: DC

## 2012-01-16 NOTE — Progress Notes (Signed)
  Subjective:    Patient ID: Bethany Jefferson, female    DOB: [birth year], 21 y.o.   MRN: [id number]  HPI Patient comes in today for follow up of  medical problems.   Thyroid :    Following at this time   Last tsh .07  And on meds. Following.  Anxiety  Seems to be getting the best of her.  Had seen  Psychologist and then psychiatrist.At school. Was told to try zoloft.   Got insomia and then felt better.    But difficult to see psych. Many reasons  Counselor psychologist   Was an Tax inspector . Changing   On med since march and does help  the anxiety   Depression still there.  Wants [place]  At this time to take over the management of zoloft.  Review of Systems Neg fever cp sob suicidality    No syncope no tremors  Past history family history social history reviewed in the electronic medical record. Outpatient Prescriptions Prior to Visit  Medication Sig Dispense Refill  . levothyroxine (SYNTHROID, LEVOTHROID) 100 MCG tablet Take 1 tablet (100 mcg total) by mouth daily.  30 tablet  9  . sertraline (ZOLOFT) 50 MG tablet Take 50 mg by mouth daily.           Objective:   Physical Exam BP 110/70  Pulse 88  Temp(Src) 98.3 F (36.8 C) (Oral)  Wt 131 lb (59.421 kg)  WDWN in nad  Minimally anxious   Oriented x 3 and no noted deficits in memory, attention, and speech. Good eye contact . Neck: Supple without adenopathy or masses or bruits Chest:  Clear to A&P without wheezes rales or rhonchi CV:  S1-S2 no gallops or murmurs peripheral perfusion is normal Well healed back scar . No clubbing cyanosis or edema No tremor     Assessment & Plan:  Mood:   requested take over management Continue with counseling supprt as possible  Going back to campus in august. Trial increase to 100 mg if tolerated but she can wait on this and rovin July . Aware that  fluctuations can contribute a bit  Sx .  Thyroid  Post ablative  Monitoring  A bit over suppressed  At this time   Following endocrine.  Total visit >  50% spent counseling and coordinating care

## 2012-01-16 NOTE — Patient Instructions (Signed)
Consider increasing to 100 mg of zoloft and this may help the sx a bit more.  The thyroid fluctuations could add a bit to your anxiety but should get better with time.   Make sure you get a follow up plan about your thyroid with Dr Everardo All.   ROV in July or as needed.

## 2012-01-23 ENCOUNTER — Encounter: Payer: Self-pay | Admitting: Internal Medicine

## 2012-01-23 DIAGNOSIS — Z79899 Other long term (current) drug therapy: Secondary | ICD-10-CM | POA: Insufficient documentation

## 2012-03-12 ENCOUNTER — Ambulatory Visit: Admitting: Internal Medicine

## 2012-04-09 ENCOUNTER — Ambulatory Visit (INDEPENDENT_AMBULATORY_CARE_PROVIDER_SITE_OTHER): Admitting: Internal Medicine

## 2012-04-09 ENCOUNTER — Encounter: Payer: Self-pay | Admitting: Internal Medicine

## 2012-04-09 VITALS — BP 94/62 | HR 85 | Temp 98.6°F | Wt 129.0 lb

## 2012-04-09 DIAGNOSIS — Z130 Encounter for screening for diseases of the blood and blood-forming organs and certain disorders involving the immune mechanism: Secondary | ICD-10-CM

## 2012-04-09 DIAGNOSIS — F4321 Adjustment disorder with depressed mood: Secondary | ICD-10-CM

## 2012-04-09 DIAGNOSIS — Z79899 Other long term (current) drug therapy: Secondary | ICD-10-CM

## 2012-04-09 DIAGNOSIS — E890 Postprocedural hypothyroidism: Secondary | ICD-10-CM

## 2012-04-09 LAB — CBC WITH DIFFERENTIAL/PLATELET
Basophils Relative: 1 % (ref 0.0–3.0)
Eosinophils Relative: 6.7 % — ABNORMAL HIGH (ref 0.0–5.0)
HCT: 33.7 % — ABNORMAL LOW (ref 36.0–46.0)
Lymphs Abs: 1.2 10*3/uL (ref 0.7–4.0)
MCV: 89.4 fl (ref 78.0–100.0)
Monocytes Absolute: 0.5 10*3/uL (ref 0.1–1.0)
Monocytes Relative: 9.4 % (ref 3.0–12.0)
RBC: 3.77 Mil/uL — ABNORMAL LOW (ref 3.87–5.11)
WBC: 5.2 10*3/uL (ref 4.5–10.5)

## 2012-04-09 LAB — TSH: TSH: 53.6 u[IU]/mL — ABNORMAL HIGH (ref 0.35–5.50)

## 2012-04-09 MED ORDER — SERTRALINE HCL 100 MG PO TABS: 100.0000 mg | ORAL_TABLET | Freq: Every day | ORAL | Status: DC

## 2012-04-09 NOTE — Progress Notes (Signed)
  Subjective:    Patient ID: Bethany Jefferson, female    DOB: 1991/06/28, 21 y.o.   MRN: [id number]  HPI Patient comes in for followup of medication  for adjustment disorder with this next features. Since her last visit she has gone up to 100 mg of Zoloft although had some initial side effects of transition zoloft nausea and dizzy for over a week   An then adapted    . She thinks that the 100 mg is doing much better. She seen a counselor every few weeks locally Dr. Jearl Klinefelter in town.  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   Got A+  Review of Systems Negative for chest pain shortness of breath syncope periods regular some cramps on menses today.  Does have fatigue a good bit and feels sleepy at times. Has a regular schedule with [employer]. She did better in this semester. Past history family history social history reviewed in the electronic medical record.     Objective:   Physical Exam BP 94/62  Pulse 85  Temp 98.6 F (37 C) (Oral)  Wt 129 lb (58.514 kg)  SpO2 99%  LMP 04/09/2012 Well-developed well-nourished in no acute distress looks well today more animated neck without bruit or adenopathy Chest:  Clear to A&P without wheezes rales or rhonchi CV:  S1-S2 no gallops or murmurs peripheral perfusion is normal Well-healed scoliosis scar Psych oriented x3 articulate cognition and attention is intact more animated does not appear depressed or anxious today.    Assessment & Plan:  Mood adjustment order appears to be doing much better on 100 mg of Zoloft and counseling support. She's been empowered by working through [employer] in the summer and containing a good course grade on 2 summer courses.  Continue the same going back to school followup in 4-6 months when has  break.  Thyroid disease post ablative to see Dr. Everardo All this week check TSH today and include a CBC and differential for anemia screening.

## 2012-04-09 NOTE — Patient Instructions (Addendum)
Continue zoloft .   tsh ordered today  For your appt with Dr Everardo All.   ROV in 4 months.  Or  When on a break.

## 2012-04-11 ENCOUNTER — Encounter: Payer: Self-pay | Admitting: Endocrinology

## 2012-04-11 ENCOUNTER — Ambulatory Visit (INDEPENDENT_AMBULATORY_CARE_PROVIDER_SITE_OTHER): Admitting: Endocrinology

## 2012-04-11 VITALS — BP 92/68 | HR 73 | Temp 97.3°F | Ht 62.5 in | Wt 131.0 lb

## 2012-04-11 DIAGNOSIS — E890 Postprocedural hypothyroidism: Secondary | ICD-10-CM

## 2012-04-11 NOTE — Patient Instructions (Addendum)
You would be healthier, and feel better if you take the thyroid pill every day.  The time of day is not very important. Please recheck the blood test at school in late September.  Please return in 1 year.

## 2012-04-11 NOTE — Progress Notes (Signed)
  Subjective:    Patient ID: Bethany Jefferson, female    DOB: 1991/05/02, 21 y.o.   MRN: [id number]  HPI Pt is 10 mos s/p i-131 rx, for hyperthyroidism due to grave's dz.  pt states she feels well in general, except for fatigue.  She takes she doesn't know how often she has missed synthroid.  Next week, she will return to school at [place].   Past Medical History  Diagnosis Date  . Asthma     as a child quiescent  . History of varicella   . Scoliosis     fusion rod  2002  . Hyperthyroidism     Past Surgical History  Procedure Date  . Spinal fusion 2002    Dr. Elliot Dally [facility], rod    History   Social History  . Marital Status: Single    Spouse Name: N/A    Number of Children: N/A  . Years of Education: N/A   Occupational History  . Not on file.   Social History Main Topics  . Smoking status: Never Smoker   . Smokeless tobacco: Not on file  . Alcohol Use: Yes     socially  . Drug Use: No  . Sexually Active:    Other Topics Concern  . Not on file   Social History Narrative   SingleHH of 4Did one year at [employer]  Transferred  to    [place] .   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     Current Outpatient Prescriptions on File Prior to Visit  Medication Sig Dispense Refill  . levothyroxine (SYNTHROID, LEVOTHROID) 100 MCG tablet Take 1 tablet (100 mcg total) by mouth daily.  30 tablet  9  . sertraline (ZOLOFT) 100 MG tablet Take 1 tablet (100 mg total) by mouth daily. Or as directed  90 tablet  1    No Known Allergies  Family History  Problem Relation Age of Onset  . GER disease Mother   . Hyperlipidemia Father   . Graves' disease Father   . Thyroid disease Father   . Crohn's disease Sister     BP 92/68  Pulse 73  Temp 97.3 F (36.3 C) (Oral)  Ht 5' 2.5" (1.588 m)  Wt 131 lb (59.421 kg)  BMI 23.58 kg/m2  SpO2 94%  LMP 04/09/2012  Review of Systems Denies weight  change    Objective:   Physical Exam VITAL SIGNS:  See vs page GENERAL: no distress NECK: There is no palpable thyroid enlargement.  No thyroid nodule is palpable.  No palpable lymphadenopathy at the anterior neck.  Lab Results  Component Value Date   TSH 53.60* 04/09/2012      Assessment & Plan:  Post-i-131 hypothyroidism, therapy limited by noncompliance.  i'll do the best i can.

## 2012-09-07 ENCOUNTER — Encounter: Payer: Self-pay | Admitting: Family

## 2012-09-07 ENCOUNTER — Ambulatory Visit (INDEPENDENT_AMBULATORY_CARE_PROVIDER_SITE_OTHER): Admitting: Family

## 2012-09-07 VITALS — BP 92/60 | HR 72 | Temp 98.0°F | Wt 139.0 lb

## 2012-09-07 DIAGNOSIS — E039 Hypothyroidism, unspecified: Secondary | ICD-10-CM

## 2012-09-07 DIAGNOSIS — L738 Other specified follicular disorders: Secondary | ICD-10-CM

## 2012-09-07 DIAGNOSIS — Z23 Encounter for immunization: Secondary | ICD-10-CM

## 2012-09-07 DIAGNOSIS — L739 Follicular disorder, unspecified: Secondary | ICD-10-CM

## 2012-09-07 LAB — TSH: TSH: 80.21 u[IU]/mL — ABNORMAL HIGH (ref 0.35–5.50)

## 2012-09-07 MED ORDER — SULFAMETHOXAZOLE-TRIMETHOPRIM 800-160 MG PO TABS: 1.0000 | ORAL_TABLET | Freq: Two times a day (BID) | ORAL | Status: DC

## 2012-09-07 NOTE — Progress Notes (Signed)
Subjective:    Patient ID: Bethany Jefferson, female    DOB: 1991/02/26, 22 y.o.   MRN: [id number]  HPI 22 year old [place] American female, nonsmoker, patient of Dr. Fabian Sharp is in today with complaints of vaginal abscesses that occur off and on x1 year. However, the a worsening female. Describes the area is red, tender and swollen. She doesn't routinely she. However, she will use an Darene Lamer, hair removal in the summer months. She is not sexually active and is a virgin. She has a history of hypothyroidism is currently being managed by Dr. Everardo All. Last TSH was 54. Medication was not adjusted at that time. However, she was advised to return in one month for recheck thyroid panel and did not. Reports taken her medication on a daily basis and does not miss doses. She's also taking Zoloft 100 mg once daily for which she shot await herself off of.   Review of Systems  Constitutional: Negative.   HENT: Negative.   Respiratory: Negative.   Cardiovascular: Negative.   Gastrointestinal: Negative.   Genitourinary: Negative.   Musculoskeletal: Negative.   Skin: Positive for rash.       Vaginal abscesses  Neurological: Negative.   Hematological: Negative.   Psychiatric/Behavioral: Negative.    Past Medical History  Diagnosis Date  . Asthma     as a child quiescent  . History of varicella   . Scoliosis     fusion rod  2002  . Hyperthyroidism     History   Social History  . Marital Status: Single    Spouse Name: N/A    Number of Children: N/A  . Years of Education: N/A   Occupational History  . Not on file.   Social History Main Topics  . Smoking status: Never Smoker   . Smokeless tobacco: Not on file  . Alcohol Use: Yes     Comment: socially  . Drug Use: No  . Sexually Active:    Other Topics Concern  . Not on file   Social History Narrative   SingleHH of 4Did one year at [employer]  Transferred  to    [place] .   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     Past Surgical History  Procedure Date  . Spinal fusion 2002    Dr. Haywood Pao, rod    Family History  Problem Relation Age of Onset  . GER disease Mother   . Hyperlipidemia Father   . Graves' disease Father   . Thyroid disease Father   . Crohn's disease Sister     No Known Allergies  Current Outpatient Prescriptions on File Prior to Visit  Medication Sig Dispense Refill  . levothyroxine (SYNTHROID, LEVOTHROID) 100 MCG tablet Take 1 tablet (100 mcg total) by mouth daily.  30 tablet  9  . sertraline (ZOLOFT) 100 MG tablet Take 1 tablet (100 mg total) by mouth daily. Or as directed  90 tablet  1    BP 92/60  Pulse 72  Temp 98 F (36.7 C) (Oral)  Wt 139 lb (63.05 kg)  SpO2 99%chart    Objective:   Physical Exam  Constitutional: She is oriented to person, place, and time. She appears well-developed and well-nourished.  Neck: Normal range of motion. Neck supple.  Cardiovascular: Normal rate, regular rhythm and normal heart sounds.   Pulmonary/Chest: Effort normal and breath sounds normal.  Abdominal: Soft. Bowel sounds are normal.  Musculoskeletal: Normal range of motion.  Neurological: She is alert and oriented to person, place, and time.  Skin: Skin is warm and dry.       Small multiple vaginal abscesses noted to the labia majora bilaterally. Pustular drainage noted. Mildly red and tender to touch. No vaginal discharge.  Psychiatric: She has a normal mood and affect.          Assessment & Plan:  Assessment: Vaginal abscess, folliculitis, hypothyroidism  Plan: Lab sent to include TSH, wound culture. Will notify patient pending results. Encouraged medication compliance. Continue current dose until we get her new thyroid level. Will treat with Bactrim DS 1 tablet twice a day x10 days. Strongly believe that her depression and skin condition could be related to her thyroid dysfunction.

## 2012-09-07 NOTE — Patient Instructions (Addendum)
Folliculitis   Folliculitis is redness, soreness, and swelling (inflammation) of the hair follicles. This condition can occur anywhere on the body. People with weakened immune systems, diabetes, or obesity have a greater risk of getting folliculitis.  CAUSES   Bacterial infection. This is the most common cause.   Fungal infection.   Viral infection.   Contact with certain chemicals, especially oils and tars.  Long-term folliculitis can result from bacteria that live in the nostrils. The bacteria may trigger multiple outbreaks of folliculitis over time.  SYMPTOMS  Folliculitis most commonly occurs on the scalp, thighs, legs, back, buttocks, and areas where hair is shaved frequently. An early sign of folliculitis is a small, white or yellow, pus-filled, itchy lesion (pustule). These lesions appear on a red, inflamed follicle. They are usually less than 0.2 inches (5 mm) wide. When there is an infection of the follicle that goes deeper, it becomes a boil or furuncle. A group of closely packed boils creates a larger lesion (carbuncle). Carbuncles tend to occur in hairy, sweaty areas of the body.  DIAGNOSIS   Your caregiver can usually tell what is wrong by doing a physical exam. A sample may be taken from one of the lesions and tested in a lab. This can help determine what is causing your folliculitis.  TREATMENT   Treatment may include:   Applying warm compresses to the affected areas.   Taking antibiotic medicines orally or applying them to the skin.   Draining the lesions if they contain a large amount of pus or fluid.   Laser hair removal for cases of long-lasting folliculitis. This helps to prevent regrowth of the hair.  HOME CARE INSTRUCTIONS   Apply warm compresses to the affected areas as directed by your caregiver.   If antibiotics are prescribed, take them as directed. Finish them even if you start to feel better.   You may take over-the-counter medicines to relieve itching.   Do not shave  irritated skin.   Follow up with your caregiver as directed.  SEEK IMMEDIATE MEDICAL CARE IF:    You have increasing redness, swelling, or pain in the affected area.   You have a fever.  MAKE SURE YOU:   Understand these instructions.   Will watch your condition.   Will get help right away if you are not doing well or get worse.  Document Released: 10/31/2001 Document Revised: 02/21/2012 Document Reviewed: 11/22/2011  ExitCare Patient Information 2013 [employer].

## 2012-09-10 ENCOUNTER — Other Ambulatory Visit: Payer: Self-pay | Admitting: Family

## 2012-09-10 LAB — WOUND CULTURE
Gram Stain: NONE SEEN
Gram Stain: NONE SEEN

## 2012-09-10 MED ORDER — LEVOTHYROXINE SODIUM 125 MCG PO TABS: 125.0000 ug | ORAL_TABLET | Freq: Every day | ORAL | Status: DC

## 2012-09-13 ENCOUNTER — Other Ambulatory Visit: Payer: Self-pay | Admitting: Family

## 2012-09-13 MED ORDER — VALACYCLOVIR HCL 500 MG PO TABS: 500.0000 mg | ORAL_TABLET | Freq: Two times a day (BID) | ORAL | Status: DC

## 2012-12-31 ENCOUNTER — Ambulatory Visit: Admitting: Endocrinology

## 2013-01-03 ENCOUNTER — Telehealth: Payer: Self-pay | Admitting: Endocrinology

## 2013-01-03 NOTE — Telephone Encounter (Signed)
Patient called again & stated that she contacted her insurance company & that they did not have any paperwork & that she would have to request documentation from her physician's office. Advised patient that she would receive a CB 01/04/13.

## 2013-01-03 NOTE — Telephone Encounter (Signed)
Patient called back. She is going to check with her insurance company to see exactly what she needs to get while is waiting for a CB from our office. She states that they are telling her that she needs proof that she has hypothyroidism and that she suffers anxiety and depression. Advised patient that generally insurance companies provide the paperwork for [place] to fill out. Patient stated that they weren't providing any & that they told her she just needed proof from [place].

## 2013-01-03 NOTE — Telephone Encounter (Signed)
Patient left a VM stating she needs her medical records sent to the school to get her FMLA. Patient is requesting a CB.

## 2013-01-03 NOTE — Telephone Encounter (Signed)
Please read note below and advise.  

## 2013-01-03 NOTE — Telephone Encounter (Signed)
Please send requests to med records

## 2013-01-04 NOTE — Telephone Encounter (Signed)
Left voicemail pt to contact medical records for request

## 2013-01-23 ENCOUNTER — Ambulatory Visit (INDEPENDENT_AMBULATORY_CARE_PROVIDER_SITE_OTHER): Admitting: Endocrinology

## 2013-01-23 ENCOUNTER — Encounter: Payer: Self-pay | Admitting: Endocrinology

## 2013-01-23 VITALS — BP 118/68 | HR 75 | Ht 62.0 in | Wt 143.0 lb

## 2013-01-23 DIAGNOSIS — E890 Postprocedural hypothyroidism: Secondary | ICD-10-CM

## 2013-01-23 LAB — TSH: TSH: 13.87 u[IU]/mL — ABNORMAL HIGH (ref 0.35–5.50)

## 2013-01-23 NOTE — Patient Instructions (Addendum)
blood tests are being requested for you today.  We'll contact you with results. Please come back for a follow-up appointment in 3 months. In view of your medical condition, you should avoid pregnancy until we have decided it is safe. In particular, it is very bad to have a pregnancy with a low thyroid level.   Please talk to dr Fabian Sharp about restarting your zoloft.   Your depression could be worsened if you are not on enough thyroid medication.

## 2013-01-23 NOTE — Progress Notes (Signed)
Subjective:    Patient ID: Bethany Jefferson, female    DOB: [birth year], 22 y.o.   MRN: [id number]  HPI In late 2012, pt had i-131 rx, for hyperthyroidism due to grave's dz.  pt states she feels well in general, except for fatigue.  In January of 2014, synthroid was increased to 125 mcg/day. She says she never misses synthroid.  She is out of school until summer school starts at [place].  pt states she feels well in general, except for slight anxiety and associated fatigue.  Her depression has recurred in the context of stopping the zoloft. Past Medical History  Diagnosis Date  . Asthma     as a child quiescent  . History of varicella   . Scoliosis     fusion rod  2002  . Hyperthyroidism     Past Surgical History  Procedure Laterality Date  . Spinal fusion  2002    Dr. Elliot Dally [facility], rod    History   Social History  . Marital Status: Single    Spouse Name: N/A    Number of Children: N/A  . Years of Education: N/A   Occupational History  . Not on file.   Social History Main Topics  . Smoking status: Never Smoker   . Smokeless tobacco: Not on file  . Alcohol Use: Yes     Comment: socially  . Drug Use: No  . Sexually Active:    Other Topics Concern  . Not on file   Social History Narrative   Single   HH of 4   Did one year at [employer]  Transferred  to    [place] .   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   Mom a Engineer, civil (consulting) at [employer]   No current partners      sophomore or such.                       Current Outpatient Prescriptions on File Prior to Visit  Medication Sig Dispense Refill  . levothyroxine (SYNTHROID, LEVOTHROID) 125 MCG tablet Take 1 tablet (125 mcg total) by mouth daily.  30 tablet  3  . sertraline (ZOLOFT) 100 MG tablet Take 1 tablet (100 mg total) by mouth daily. Or as directed  90 tablet  1  . sulfamethoxazole-trimethoprim (BACTRIM DS,SEPTRA DS) 800-160 MG per tablet Take 1 tablet by mouth 2 (two) times daily.  20  tablet  0  . valACYclovir (VALTREX) 500 MG tablet Take 1 tablet (500 mg total) by mouth 2 (two) times daily.  30 tablet  3   No current facility-administered medications on file prior to visit.    No Known Allergies  Family History  Problem Relation Age of Onset  . GER disease Mother   . Hyperlipidemia Father   . Graves' disease Father   . Thyroid disease Father   . Crohn's disease Sister     BP 118/68  Pulse 75  Ht 5\' 2"  (1.575 m)  Wt 143 lb (64.864 kg)  BMI 26.15 kg/m2  SpO2 98%  Review of Systems She has weight gain.  She has irreg menses.      Objective:   Physical Exam VITAL SIGNS:  See vs page GENERAL: no distress head: no deformity eyes: no periorbital swelling, no proptosis external nose and ears are normal mouth: no lesion seen NECK: There is no palpable thyroid enlargement.  No thyroid nodule is palpable.  No palpable lymphadenopathy at the anterior  neck.   Skin: not diaphoretic Neuro: no tremor.     Assessment & Plan:  Post-i-131 hypothyroidism, on rx Depression, therapy limited by noncompliance.  i'll do the best i can. Grave's dz.  No evidence of eye involvement.

## 2013-01-24 ENCOUNTER — Other Ambulatory Visit: Payer: Self-pay | Admitting: Endocrinology

## 2013-01-24 DIAGNOSIS — E890 Postprocedural hypothyroidism: Secondary | ICD-10-CM

## 2013-01-24 MED ORDER — LEVOTHYROXINE SODIUM 150 MCG PO TABS: 150.0000 ug | ORAL_TABLET | Freq: Every day | ORAL | Status: DC

## 2013-02-07 ENCOUNTER — Telehealth: Payer: Self-pay | Admitting: Internal Medicine

## 2013-02-07 NOTE — Telephone Encounter (Signed)
Please schedule pt for an appt

## 2013-02-07 NOTE — Telephone Encounter (Signed)
Patient Information:  Caller Name: Idalee  Phone: [phone]  Patient: Bethany Jefferson  Gender: Female  DOB: [birth year]  Age: 22 Years  PCP: Berniece Andreas [facility])  Pregnant: No  Office Follow Up:  Does the office need to follow up with this patient?: Yes  Instructions For The Office: Please call back regarding work-in appointment for 02/07/13.  RN Note:  Sore throat began after sensation of foreign body in throat. Suspected kernel of popcorn stuck due to sudden throat pain while eating it.  Pain rated 3-4/10 and constant.  Drinking and swallowing normally. No appointments remain for 02/07/13.  Information sent to office staff for call back regarding work in appointment 02/07/13.   Symptoms  Reason For Call & Symptoms: Sore throat.  Right sided throat pain began eating popcorn 02/03/13; noted  sharp pain like piece of popcorn kernal stuck in throat.  Continue to have a FB sensation in throat. Throat soreness increased.  Reviewed Health History In EMR: Yes  Reviewed Medications In EMR: Yes  Reviewed Allergies In EMR: Yes  Reviewed Surgeries / Procedures: Yes  Date of Onset of Symptoms: 02/06/2013  Treatments Tried: gargled with salt water  Treatments Tried Worked: No OB / GYN:  LMP: 01/17/2013  Guideline(s) Used:  Sore Throat  Disposition Per Guideline:   See Today in Office  Reason For Disposition Reached:   Patient wants to be seen  Advice Given:  For Relief of Sore Throat Pain:  Sip warm chicken broth or apple juice.  Suck on hard candy or a throat lozenge (over-the-counter).  Gargle warm salt water 3 times daily (1 teaspoon of salt in 8 oz or 240 ml of warm water).  Pain Medicines:  Ibuprofen (e.g., Motrin, Advil):  Take 400 mg (two 200 mg pills) by mouth every 6 hours.  Another choice is to take 600 mg (three 200 mg pills) by mouth every 8 hours.  The most you should take each day is 1,200 mg (six 200 mg pills), unless your doctor has told you to take more.  Call Back If:  Sore throat is the main symptom and it lasts longer than 24 hours  Fever lasts longer than 3 days  You become worse.  Patient Will Follow Care Advice:  YES

## 2013-02-08 NOTE — Telephone Encounter (Signed)
appt set/kh 

## 2013-02-11 ENCOUNTER — Ambulatory Visit (INDEPENDENT_AMBULATORY_CARE_PROVIDER_SITE_OTHER): Admitting: Internal Medicine

## 2013-02-11 ENCOUNTER — Encounter: Payer: Self-pay | Admitting: Internal Medicine

## 2013-02-11 VITALS — BP 96/72 | HR 74 | Temp 98.4°F | Wt 139.0 lb

## 2013-02-11 DIAGNOSIS — F4321 Adjustment disorder with depressed mood: Secondary | ICD-10-CM

## 2013-02-11 DIAGNOSIS — E890 Postprocedural hypothyroidism: Secondary | ICD-10-CM

## 2013-02-11 DIAGNOSIS — L089 Local infection of the skin and subcutaneous tissue, unspecified: Secondary | ICD-10-CM

## 2013-02-11 DIAGNOSIS — R6889 Other general symptoms and signs: Secondary | ICD-10-CM

## 2013-02-11 MED ORDER — SULFAMETHOXAZOLE-TRIMETHOPRIM 800-160 MG PO TABS: 1.0000 | ORAL_TABLET | Freq: Two times a day (BID) | ORAL | Status: DC

## 2013-02-11 NOTE — Progress Notes (Signed)
Chief Complaint  Patient presents with  . Sore Throat    Had some food stuck in her throat.  Now is left with a sore throat.  Also says she has some vaginal discharge along with sores on her vagina.  . Vaginal Discharge  . Vaginal Odor    HPI: Patient comes in today for SDA for  problems evaluation.  Acute  Right sided throat   Pain with swallowiung  Was worse but now getting bettter barely feels it.  Had no fever or real cold.  No swollen glands or dental problems. These symptoms occurred about 4-5 days ago over the weekend and has since gotten better. Mother wanted her to have it checked out however  Now has  a problem the vaginal area that she states is recurrent. She was treated for a bacterial infection earlier in the year that helped and thinks it is back she describes it as itchy painful bumps in the vaginal area. No excess vaginal discharge per se. No fever or adenopathy. Not high-risk for STI Thyroid disease saw Dr. Everardo All recently still not euthyroid.   Has some question about mood and depression to someone to take really medication again counselor that she had at school who was good left. Doesn't have one at this time. No alarming symptoms. Discussed strategies going back to school in the fall ROS: See pertinent positives and negatives per HPI.  Past Medical History  Diagnosis Date  . Asthma     as a child quiescent  . History of varicella   . Scoliosis     fusion rod  2002  . Hyperthyroidism     Family History  Problem Relation Age of Onset  . GER disease Mother   . Hyperlipidemia Father   . Graves' disease Father   . Thyroid disease Father   . Crohn's disease Sister     History   Social History  . Marital Status: Single    Spouse Name: N/A    Number of Children: N/A  . Years of Education: N/A   Social History Main Topics  . Smoking status: Never Smoker   . Smokeless tobacco: None  . Alcohol Use: Yes     Comment: socially  . Drug Use: No  . Sexually  Active:    Other Topics Concern  . None   Social History Narrative   Single   HH of 4   Did one year at [employer]  Transferred  to    [place] .   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   Mom a Engineer, civil (consulting) at [employer]   No current partners      sophomore or such.                       Outpatient Encounter Prescriptions as of 02/11/2013  Medication Sig Dispense Refill  . levothyroxine (SYNTHROID, LEVOTHROID) 150 MCG tablet Take 1 tablet (150 mcg total) by mouth daily before breakfast.  30 tablet  11  . sulfamethoxazole-trimethoprim (BACTRIM DS,SEPTRA DS) 800-160 MG per tablet Take 1 tablet by mouth 2 (two) times daily.  14 tablet  0  . [DISCONTINUED] sertraline (ZOLOFT) 100 MG tablet Take 1 tablet (100 mg total) by mouth daily. Or as directed  90 tablet  1  . [DISCONTINUED] sulfamethoxazole-trimethoprim (BACTRIM DS,SEPTRA DS) 800-160 MG per tablet Take 1 tablet by mouth 2 (two) times daily.  20 tablet  0  . [DISCONTINUED] valACYclovir (VALTREX) 500 MG tablet Take  1 tablet (500 mg total) by mouth 2 (two) times daily.  30 tablet  3   No facility-administered encounter medications on file as of 02/11/2013.    EXAM:  BP 96/72  Pulse 74  Temp(Src) 98.4 F (36.9 C) (Oral)  Wt 139 lb (63.05 kg)  BMI 25.42 kg/m2  SpO2 98%  LMP 01/21/2013  Body mass index is 25.42 kg/(m^2).  GENERAL: vitals reviewed and listed above, alert, oriented, appears well hydrated and in no acute distress  HEENT: atraumatic, conjunctiva  clear, no obvious abnormalities on inspection of external nose and ears OP : No edema tonsils 0 to +1 there might be a tiny pinpoint white millimeter spot on the right but no concretion or mass NECK: no obvious masses on inspection palpation no adenopathy  LUNGS: clear to auscultation bilaterally, no wheezes, rales or rhonchi, good air movement CV: HRRR, no clubbing cyanosis or  peripheral edema nl cap refill  Abdomen soft without organomegaly guarding or rebound MS:  moves all extremities without noticeable focal  abnormality External GU no significant discharge or adenopathy there is a linear raised bump almost pustular not vesicular area on the) a.m. There is a healing place on the left. No other obvious lesions PSYCH: pleasant and cooperative, good eye contact Reviewed the record no significant abnormal bacteria on culture test. ASSESSMENT AND PLAN:  Discussed the following assessment and plan:  Throat symptom - This appears to be brought resolving should followup and let us check if persistent ;probably result infection  Local skin infection - right  perineal panty line area   linear fashion. ? folloicular but atypical for herpetic and bacterial     low risk  has never been on valtrex   Adjustment disorder with depressed mood - Counseled see note   did not do vaginal swabs today because area was not at the vagina. We'll treat for yeast and bacteria if persistent progressive needs more of an evaluation. Patient states that she was never on Valtrex and doesn't know why it's on her med list. Please take it off. I traced it back to the visit with her vaginal symptoms but there was no notification in the note of giving Valtrex. Assume it was an error. It was not taken off the medical record.  -Patient advised to return or notify health care team  if symptoms worsen or persist or new concerns arise.  Patient Instructions  We can retreat  With antibiotic like last time    Avoid  Pressure from panty line  . If you get  Itchy vaginal discharge can treat with OTC year medication such as monistat.but this doesn't look like yeast. If not better or recurrent then let us recheck again.    If the throat symptoms come back  Or have persistent feeling of problem with right throat over weeks  Contact us for advice  Sometime we get and ENT to check the throat but your exam is reassuring today.    Neta Mends. Panosh M.D.

## 2013-02-11 NOTE — Patient Instructions (Signed)
We can retreat  With antibiotic like last time    Avoid  Pressure from panty line  . If you get  Itchy vaginal discharge can treat with OTC year medication such as monistat.but this doesn't look like yeast. If not better or recurrent then let us recheck again.    If the throat symptoms come back  Or have persistent feeling of problem with right throat over weeks  Contact us for advice  Sometime we get and ENT to check the throat but your exam is reassuring today.

## 2013-02-15 DIAGNOSIS — R6889 Other general symptoms and signs: Secondary | ICD-10-CM | POA: Insufficient documentation

## 2013-02-15 DIAGNOSIS — L089 Local infection of the skin and subcutaneous tissue, unspecified: Secondary | ICD-10-CM | POA: Insufficient documentation

## 2013-02-27 ENCOUNTER — Ambulatory Visit (INDEPENDENT_AMBULATORY_CARE_PROVIDER_SITE_OTHER): Admitting: Endocrinology

## 2013-02-27 ENCOUNTER — Encounter: Payer: Self-pay | Admitting: Endocrinology

## 2013-02-27 VITALS — BP 122/76 | HR 70 | Ht 62.0 in | Wt 138.0 lb

## 2013-02-27 DIAGNOSIS — E890 Postprocedural hypothyroidism: Secondary | ICD-10-CM

## 2013-02-27 NOTE — Patient Instructions (Signed)
blood tests are being requested for you today.  We'll contact you with results. Please come back for a follow-up appointment in 3 months. In view of your medical condition, you should avoid pregnancy until we have decided it is safe. In particular, it is very bad to have a pregnancy with a low thyroid level.

## 2013-02-27 NOTE — Progress Notes (Signed)
  Subjective:    Patient ID: Bethany Jefferson, female    DOB: 1990/09/30, 22 y.o.   MRN: [id number]  HPI In late 2012, pt had i-131 rx, for hyperthyroidism due to grave's dz.  synthroid was recently increased to 150 mcg/day.  She says she never misses synthroid.  pt states she feels well in general, except for slight anxiety and depression.  However, these are better since she increased the synthroid.   Past Medical History  Diagnosis Date  . Asthma     as a child quiescent  . History of varicella   . Scoliosis     fusion rod  2002  . Hyperthyroidism     Past Surgical History  Procedure Laterality Date  . Spinal fusion  2002    Dr. Elliot Dally [facility], rod    History   Social History  . Marital Status: Single    Spouse Name: N/A    Number of Children: N/A  . Years of Education: N/A   Occupational History  . Not on file.   Social History Main Topics  . Smoking status: Never Smoker   . Smokeless tobacco: Not on file  . Alcohol Use: Yes     Comment: socially  . Drug Use: No  . Sexually Active:    Other Topics Concern  . Not on file   Social History Narrative   Single   HH of 4   Did one year at [employer]  Transferred  to    [place] .   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   Mom a Engineer, civil (consulting) at [employer]   No current partners      sophomore or such.                       Current Outpatient Prescriptions on File Prior to Visit  Medication Sig Dispense Refill  . levothyroxine (SYNTHROID, LEVOTHROID) 150 MCG tablet Take 1 tablet (150 mcg total) by mouth daily before breakfast.  30 tablet  11  . sulfamethoxazole-trimethoprim (BACTRIM DS,SEPTRA DS) 800-160 MG per tablet Take 1 tablet by mouth 2 (two) times daily.  14 tablet  0   No current facility-administered medications on file prior to visit.   No Known Allergies  Family History  Problem Relation Age of Onset  . GER disease Mother   . Hyperlipidemia Father   . Graves' disease Father   .  Thyroid disease Father   . Crohn's disease Sister    BP 122/76  Pulse 70  Ht 5\' 2"  (1.575 m)  Wt 138 lb (62.596 kg)  BMI 25.23 kg/m2  SpO2 94%  LMP 01/21/2013  Review of Systems She has lost a few lbs.  She denies excessive diaphoresis.      Objective:   Physical Exam VITAL SIGNS:  See vs page GENERAL: no distress NECK: There is no palpable thyroid enlargement.  No thyroid nodule is palpable.  No palpable lymphadenopathy at the anterior neck.   Lab Results  Component Value Date   TSH 0.17* 02/27/2013      Assessment & Plan:  Post-i-131 hypothyroidism.  As TSH has fluctuated greatly, i favor continuing same rx.

## 2013-03-13 NOTE — Progress Notes (Signed)
Pt goes to school 4 hrs away and she will call to schedule her next appt.jkeith

## 2013-04-01 ENCOUNTER — Encounter: Payer: Self-pay | Admitting: Internal Medicine

## 2013-04-01 ENCOUNTER — Other Ambulatory Visit (HOSPITAL_COMMUNITY)
Admission: RE | Admit: 2013-04-01 | Discharge: 2013-04-01 | Disposition: A | Discharge status: Home / Self Care | Source: Ambulatory Visit | Attending: Internal Medicine | Admitting: Internal Medicine

## 2013-04-01 ENCOUNTER — Ambulatory Visit (INDEPENDENT_AMBULATORY_CARE_PROVIDER_SITE_OTHER): Admitting: Internal Medicine

## 2013-04-01 VITALS — BP 102/72 | HR 73 | Temp 98.3°F | Ht 63.0 in | Wt 138.0 lb

## 2013-04-01 DIAGNOSIS — D649 Anemia, unspecified: Secondary | ICD-10-CM

## 2013-04-01 DIAGNOSIS — Z01419 Encounter for gynecological examination (general) (routine) without abnormal findings: Secondary | ICD-10-CM

## 2013-04-01 DIAGNOSIS — Z0000 Encounter for general adult medical examination without abnormal findings: Secondary | ICD-10-CM

## 2013-04-01 DIAGNOSIS — L0293 Carbuncle, unspecified: Secondary | ICD-10-CM

## 2013-04-01 DIAGNOSIS — L0292 Furuncle, unspecified: Secondary | ICD-10-CM

## 2013-04-01 DIAGNOSIS — F4323 Adjustment disorder with mixed anxiety and depressed mood: Secondary | ICD-10-CM

## 2013-04-01 DIAGNOSIS — N760 Acute vaginitis: Secondary | ICD-10-CM | POA: Insufficient documentation

## 2013-04-01 DIAGNOSIS — E890 Postprocedural hypothyroidism: Secondary | ICD-10-CM

## 2013-04-01 DIAGNOSIS — Z113 Encounter for screening for infections with a predominantly sexual mode of transmission: Secondary | ICD-10-CM | POA: Insufficient documentation

## 2013-04-01 LAB — BASIC METABOLIC PANEL
BUN: 11 mg/dL (ref 6–23)
Calcium: 9.3 mg/dL (ref 8.4–10.5)
GFR: 110.58 mL/min (ref >60.00)
Glucose, Bld: 86 mg/dL (ref 70–99)
Potassium: 4.4 mEq/L (ref 3.5–5.1)
Sodium: 137 mEq/L (ref 135–145)

## 2013-04-01 LAB — CBC WITH DIFFERENTIAL/PLATELET
Basophils Absolute: 0 10*3/uL (ref 0.0–0.1)
Eosinophils Relative: 6 % — ABNORMAL HIGH (ref 0.0–5.0)
HCT: 34.9 % — ABNORMAL LOW (ref 36.0–46.0)
Lymphocytes Relative: 26.6 % (ref 12.0–46.0)
Monocytes Relative: 8.5 % (ref 3.0–12.0)
Platelets: 277 10*3/uL (ref 150.0–400.0)
RDW: 14.6 % (ref 11.5–14.6)
WBC: 5.4 10*3/uL (ref 4.5–10.5)

## 2013-04-01 LAB — HEPATIC FUNCTION PANEL
ALT: 11 U/L (ref 0–35)
AST: 17 U/L (ref 0–37)
Alkaline Phosphatase: 37 U/L — ABNORMAL LOW (ref 39–117)
Bilirubin, Direct: 0.1 mg/dL (ref 0.0–0.3)
Total Bilirubin: 0.6 mg/dL (ref 0.3–1.2)

## 2013-04-01 LAB — LIPID PANEL
LDL Cholesterol: 105 mg/dL — ABNORMAL HIGH (ref 0–99)
Total CHOL/HDL Ratio: 3
VLDL: 10.2 mg/dL (ref 0.0–40.0)

## 2013-04-01 LAB — T4, FREE: Free T4: 1.19 ng/dL (ref 0.60–1.60)

## 2013-04-01 LAB — FERRITIN: Ferritin: 11.7 ng/mL (ref 10.0–291.0)

## 2013-04-01 LAB — TSH: TSH: 6.42 u[IU]/mL — ABNORMAL HIGH (ref 0.35–5.50)

## 2013-04-01 MED ORDER — DOXYCYCLINE HYCLATE 100 MG PO CAPS: 100.0000 mg | ORAL_CAPSULE | Freq: Two times a day (BID) | ORAL | Status: DC

## 2013-04-01 NOTE — Patient Instructions (Addendum)
Recheck labs today will let you know those results And arrange for another opinion on thyroid. Please reestablish with a counselor this is very important in regard to the anxiety and mood Thyroid issues can also affect mood but may not be the primary cause.  We will add an antibiotic again for the boil-like lesions that are recurring. The rest of your exam is normal. If you wish we can write a letter to office of disability services.

## 2013-04-01 NOTE — Progress Notes (Signed)
Chief Complaint  Patient presents with  . Annual Exam    Couple of issues boils anxiety thyroid letter for school    HPI: Patient comes in today for Preventive Health Care visit     Thyroid   Felt worse .  Has a low TSH but Dr. Everardo All wants her to stay on the same dose of medicine. She says that when her medication dose was increased she had increasing anxiety especially in the morning some racing heart. She states she has underlying anxiety anyway but this felt worse. She doesn't understand explanation of why she is on the higher dose. Would like to see a different endocrinologist  Some days she has depression but not severe was on Zoloft in the past which helps some but doesn't like being on medicine. We'll get counselor when she goes back to school has decided to go ahead and have [place] provide documentation that her medical and psychological condition could interfere with her schoolwork at any given time.  She is having inflammation again in the suprapubic area negative SA no significant vaginal discharge history of antibiotic treatment with Septra and her mother passed. Would like to have another Pap smear done today  Periods are slightly irregular anywhere from 3-5 weeks. Otherwise normal.  Negative tobacco only rare occasional alcohol minimal caffeine not daily   ROS:  GEN/ HEENT: No fever, significant weight changes sweats headaches vision problems hearing changes, CV/ PULM; No chest pain shortness of breath cough, syncope,edema  change in exercise tolerance. GI /GU: No adominal pain, vomiting, change in bowel habits. No blood in the stool. No significant GU symptoms. SKIN/HEME: ,no acute skin rashes suspicious lesions or bleeding. No lymphadenopathy, nodules, masses.  NEURO/ PSYCH:  No neurologic signs such as weakness numbness. No depression anxiety. IMM/ Allergy: No unusual infections.  Allergy .   REST of 12 system review negative except as per HPI   Past Medical History   Diagnosis Date  . Asthma     as a child quiescent  . History of varicella   . Scoliosis     fusion rod  2002  . Hyperthyroidism     Family History  Problem Relation Age of Onset  . GER disease Mother   . Hyperlipidemia Father   . Graves' disease Father   . Thyroid disease Father   . Crohn's disease Sister     History   Social History  . Marital Status: Single    Spouse Name: N/A    Number of Children: N/A  . Years of Education: N/A   Social History Main Topics  . Smoking status: Never Smoker   . Smokeless tobacco: None  . Alcohol Use: Yes     Comment: socially  . Drug Use: No  . Sexually Active:    Other Topics Concern  . None   Social History Narrative   Single   HH of 4   Did one year at [employer]  Transferred  to    [place] .   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     Outpatient Encounter Prescriptions as of 04/01/2013  Medication Sig Dispense Refill  . levothyroxine (SYNTHROID, LEVOTHROID) 150 MCG tablet Take 1 tablet (150 mcg total) by mouth daily before breakfast.  30 tablet  11  . doxycycline (VIBRAMYCIN) 100 MG capsule Take  1 capsule (100 mg total) by mouth 2 (two) times daily.  20 capsule  0  . [DISCONTINUED] sulfamethoxazole-trimethoprim (BACTRIM DS,SEPTRA DS) 800-160 MG per tablet Take 1 tablet by mouth 2 (two) times daily.  14 tablet  0   No facility-administered encounter medications on file as of 04/01/2013.    EXAM:  BP 102/72  Pulse 73  Temp(Src) 98.3 F (36.8 C) (Oral)  Ht 5\' 3"  (1.6 m)  Wt 138 lb (62.596 kg)  BMI 24.45 kg/m2  SpO2 97%  LMP 03/04/2013  Body mass index is 24.45 kg/(m^2).  Physical Exam: Vital signs reviewed NWG:NFAO is a well-developed well-nourished alert cooperative   female who appears her stated age in no acute distress.  HEENT: normocephalic atraumatic , Eyes: PERRL EOM's full, conjunctiva  clear, Nares: paten,t no deformity discharge or tenderness., Ears: no deformity EAC's clear TMs with normal landmarks. Mouth: clear OP, no lesions, edema.  Moist mucous membranes. Dentition in adequate repair. NECK: supple without masses,  or bruits. CHEST/PULM:  Clear to auscultation and percussion breath sounds equal no wheeze , rales or rhonchi. No chest wall deformities or tenderness. Breast: normal by inspection . No dimpling, discharge, masses, tenderness or discharge . Some lumpiness upper quadrants left more than right but no discrete nodule CV: PMI is nondisplaced, S1 S2 no gallops, murmurs, rubs. Peripheral pulses are full without delay.No JVD .  ABDOMEN: Bowel sounds normal nontender  No guard or rebound, no hepato splenomegal no CVA tenderness.  No hernia. Extremtities:  No clubbing cyanosis or edema, no acute joint swelling or redness no focal atrophy NEURO:  Oriented x3, cranial nerves 3-12 appear to be intact, no obvious focal weakness,gait within normal limits no abnormal reflexes or asymmetrical SKIN: No acute rashes normal turgor, color, no bruising or petechiae. Pelvic: NL ext GU, labia clear without lesions suprapubic area has multiple small boils with 11 cm left no adenopathy no drainage. Vagina no lesions .Cervix: clear  UTERUS: Neg CMT Adnexa:  clear no masses . PAP done possibly suboptimal exam because the patient anxiety and muscle tension but I feel this is a normal exam. PSYCH: Oriented, good eye contact,  mild anxiety normal speech no tremor cognition and judgment appear normal.  LN: no cervical axillary inguinal adenopathy Pap yeast gc chl screen done  Lab Results  Component Value Date   WBC 5.2 04/09/2012   HGB 10.9* 04/09/2012   HCT 33.7* 04/09/2012   PLT 264.0 04/09/2012   GLUCOSE 74 12/10/2009   CHOL 178 11/05/2009   TRIG 66.0 11/05/2009   HDL 62.30 11/05/2009   LDLCALC 103* 11/05/2009   ALT 16 11/05/2009   AST 21 11/05/2009   NA 142 12/10/2009   K 3.6 12/10/2009   CL 108 12/10/2009    CREATININE 0.8 12/10/2009   BUN 10 12/10/2009   CO2 28 12/10/2009   TSH 0.17* 02/27/2013    ASSESSMENT AND PLAN:  Discussed the following assessment and plan:  Encounter for preventive health examination - Plan: Basic metabolic panel, CBC with Differential, Hepatic function panel, Lipid panel, TSH, T4, free, Ferritin, PAP [[place]]  Other postablative hypothyroidism - Plan: Basic metabolic panel, CBC with Differential, Hepatic function panel, Lipid panel, TSH, T4, free, Ferritin  ANEMIA, MILD - Plan: Basic metabolic panel, CBC with Differential, Hepatic function panel, Lipid panel, TSH, T4, free, Ferritin  Boils - poss hiddradenitis  ADJUSTMENT DISORDER WITH MIXED FEATURES - current ly aggravated  It is difficult to tell how much of the thyroid condition is  aggravating her anxiety. But I believe it is part of the problem. Recheck routine labs today including her TSH she will get back with [place] about other endocrinologists and her insurance network possibly closer to school. She will get back in with counselor back at school. Contact us if she's interested in going back on a medicine. After reviewing discussion will write a letter to [employer] office of disability services. We'll contact her to pick up a letter when available. Patient Care Team: Madelin Headings, MD as PCP - General Romero Belling, MD as Attending Physician (Endocrinology) Patient Instructions  Recheck labs today will let you know those results And arrange for another opinion on thyroid. Please reestablish with a counselor this is very important in regard to the anxiety and mood Thyroid issues can also affect mood but may not be the primary cause.  We will add an antibiotic again for the boil-like lesions that are recurring. The rest of your exam is normal. If you wish we can write a letter to office of disability services.    Neta Mends. Panosh M.D.  Health Maintenance  Topic Date Due  . Tetanus/tdap   09/16/2008  . Influenza Vaccine  05/06/2013  . Pap Smear  03/24/2014   Health Maintenance Review

## 2013-04-08 ENCOUNTER — Other Ambulatory Visit: Payer: Self-pay | Admitting: Family Medicine

## 2013-04-08 MED ORDER — METRONIDAZOLE 500 MG PO TABS: 500.0000 mg | ORAL_TABLET | Freq: Two times a day (BID) | ORAL | Status: DC

## 2013-04-14 ENCOUNTER — Telehealth: Payer: Self-pay | Admitting: Endocrinology

## 2013-04-14 NOTE — Telephone Encounter (Signed)
please call patient: i received records from dr Fabian Sharp.  Please continue the same thyroid medication.

## 2013-04-15 ENCOUNTER — Telehealth: Payer: Self-pay | Admitting: Internal Medicine

## 2013-04-15 NOTE — Telephone Encounter (Signed)
Pt advised.

## 2013-04-15 NOTE — Telephone Encounter (Signed)
Patient notified to pick up at the front desk. 

## 2013-04-15 NOTE — Telephone Encounter (Signed)
Pt states that Dr Fabian Sharp was to do a letter for her about disability services for college. WP stated it would be done before she leaves for college. Pt leaves Thursday and has heard nothing about the letter. Please advise and call.

## 2013-06-20 ENCOUNTER — Ambulatory Visit (INDEPENDENT_AMBULATORY_CARE_PROVIDER_SITE_OTHER): Admitting: Internal Medicine

## 2013-06-20 ENCOUNTER — Encounter: Payer: Self-pay | Admitting: Internal Medicine

## 2013-06-20 VITALS — BP 94/66 | HR 71 | Temp 98.1°F | Wt 144.0 lb

## 2013-06-20 DIAGNOSIS — F4321 Adjustment disorder with depressed mood: Secondary | ICD-10-CM

## 2013-06-20 DIAGNOSIS — Z23 Encounter for immunization: Secondary | ICD-10-CM

## 2013-06-20 DIAGNOSIS — E890 Postprocedural hypothyroidism: Secondary | ICD-10-CM

## 2013-06-20 MED ORDER — SERTRALINE HCL 50 MG PO TABS: 50.0000 mg | ORAL_TABLET | Freq: Every day | ORAL | Status: DC

## 2013-06-20 NOTE — Progress Notes (Signed)
Chief Complaint  Patient presents with  . Follow-up    HPI: Patient comes in today for follow up of  multiple medical problems.   Depression is worse   Seeing counselor and she advises medication again  And patient willing at this time  .    Is registered with  [employer] Passing courses  But not as  good as she should .  Seem  SW some cs isolating herseklf  Thyroid  Is on same medication.     synthroid  150    1 po   Taking daily.  Sleep  : depends on anxiety  lexapro was not effective .  zoloft helped later initial insomnia  Thyroid med continuing same dose   Per dr Everardo All  ROS: See pertinent positives and negatives per HPI. No cp sob periods ok no risk of preg  Neg tad except social etoh   Past Medical History  Diagnosis Date  . Asthma     as a child quiescent  . History of varicella   . Scoliosis     fusion rod  2002  . Hyperthyroidism     rai ablation 8 12     Family History  Problem Relation Age of Onset  . GER disease Mother   . Hyperlipidemia Father   . Graves' disease Father   . Thyroid disease Father   . Crohn's disease Sister     History   Social History  . Marital Status: Single    Spouse Name: N/A    Number of Children: N/A  . Years of Education: N/A   Social History Main Topics  . Smoking status: Never Smoker   . Smokeless tobacco: None  . Alcohol Use: Yes     Comment: socially  . Drug Use: No  . Sexual Activity:    Other Topics Concern  . None   Social History Narrative   Single   HH of 4   Did one year at [employer]  Transferred  to    [place] .   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     Outpatient Encounter Prescriptions as of 06/20/2013  Medication Sig Dispense Refill  . levothyroxine (SYNTHROID, LEVOTHROID) 150 MCG tablet Take 1 tablet (150 mcg total) by mouth daily before breakfast.  30 tablet  11  . sertraline (ZOLOFT)  50 MG tablet Take 1 tablet (50 mg total) by mouth daily.  30 tablet  3  . [DISCONTINUED] doxycycline (VIBRAMYCIN) 100 MG capsule Take 1 capsule (100 mg total) by mouth 2 (two) times daily.  20 capsule  0  . [DISCONTINUED] metroNIDAZOLE (FLAGYL) 500 MG tablet Take 1 tablet (500 mg total) by mouth 2 (two) times daily.  14 tablet  0   No facility-administered encounter medications on file as of 06/20/2013.    EXAM:  BP 94/66  Pulse 71  Temp(Src) 98.1 F (36.7 C) (Oral)  Wt 144 lb (65.318 kg)  BMI 25.51 kg/m2  SpO2 99%  LMP 05/27/2013  Body mass index is 25.51 kg/(m^2).  GENERAL: vitals reviewed and listed above, alert, oriented, appears well hydrated and in no acute distress good eye contact HEENT: atraumatic, conjunctiva  clear, no obvious abnormalities on inspection of external nose and ears  NECK: no obvious masses on inspection palpation  LUNGS: clear to auscultation bilaterally, no wheezes,  rales or rhonchi, good air movement CV: HRRR, no clubbing cyanosis or  peripheral edema nl cap refill  Abdomen:  Sof,t normal bowel sounds without hepatosplenomegaly, no guarding rebound or masses no CVA tenderness MS: moves all extremities without noticeable focal  abnormality PSYCH: pleasant and cooperative,  mildly flat.  affect  Good eye contact  PHQ 9  Total  18      3 on q 2-4 and 6-7 2 on    2 on 1  Not suicidal.  Very difficult  ASSESSMENT AND PLAN:  Discussed the following assessment and plan:  Adjustment disorder with depressed mood - CW mod severe sx phq 9 18  - Plan: TSH  Other postablative hypothyroidism - recheck tsh result to get to der Everardo All - Plan: TSH  Need for prophylactic vaccination and inoculation against influenza - Plan: Flu Vaccine QUAD 36+ mos PF IM (Fluarix)  -Patient advised to return or notify health care team  if symptoms worsen or persist or new concerns arise.  Patient Instructions  Will notify you  of labs when available. restart zoloft  25 mg and  increase to 50 mg which is still a low dose . You may do better on a 100 mg  Dose. Get appt for day before t giving and can cancel if cant make it  Call of recheck  In 3-4 weeks about how you are doing and then decide on med dose change .    Plan fu  Visit when back in town.     Neta Mends. Kayloni Rocco M.D.

## 2013-06-20 NOTE — Patient Instructions (Addendum)
Will notify you  of labs when available. restart zoloft  25 mg and increase to 50 mg which is still a low dose . You may do better on a 100 mg  Dose. Get appt for day before t giving and can cancel if cant make it  Call of recheck  In 3-4 weeks about how you are doing and then decide on med dose change .    Plan fu  Visit when back in town.

## 2013-09-16 ENCOUNTER — Encounter: Payer: Self-pay | Admitting: Internal Medicine

## 2013-09-16 NOTE — Telephone Encounter (Signed)
Glad she is doing better .  Ok to refill zoloft  50 mg  1 po qd disp 90 refill x 1  Ask her which pharmacy cuase she is as school i think. ALso ask her to make sure she gets a fu TSH level   ROV when she is back from school

## 2013-09-17 ENCOUNTER — Other Ambulatory Visit: Payer: Self-pay | Admitting: Family Medicine

## 2013-09-17 MED ORDER — SERTRALINE HCL 50 MG PO TABS: 50.0000 mg | ORAL_TABLET | Freq: Every day | ORAL | Status: DC

## 2013-10-28 ENCOUNTER — Encounter: Payer: Self-pay | Admitting: Internal Medicine

## 2013-10-28 ENCOUNTER — Ambulatory Visit (INDEPENDENT_AMBULATORY_CARE_PROVIDER_SITE_OTHER): Admitting: Internal Medicine

## 2013-10-28 VITALS — BP 96/60 | Temp 98.4°F | Ht 63.0 in | Wt 141.0 lb

## 2013-10-28 DIAGNOSIS — E890 Postprocedural hypothyroidism: Secondary | ICD-10-CM

## 2013-10-28 DIAGNOSIS — IMO0002 Reserved for concepts with insufficient information to code with codable children: Secondary | ICD-10-CM

## 2013-10-28 DIAGNOSIS — L02419 Cutaneous abscess of limb, unspecified: Secondary | ICD-10-CM

## 2013-10-28 MED ORDER — HYDROCODONE-ACETAMINOPHEN 5-325 MG PO TABS: 1.0000 | ORAL_TABLET | ORAL | Status: DC | PRN

## 2013-10-28 MED ORDER — CLINDAMYCIN HCL 300 MG PO CAPS: 300.0000 mg | ORAL_CAPSULE | Freq: Three times a day (TID) | ORAL | Status: DC

## 2013-10-28 NOTE — Patient Instructions (Addendum)
This is an abscess  In a difficult place  And usually needs drainage and antibiotic to get better and keep from getting worse   Concern that the area could be tracking. appt with surgery tomorrow  In the meantime antibiotic and pain medication  But if getting worse  Or fever   Go to the ed .

## 2013-10-28 NOTE — Progress Notes (Signed)
Pre visit review using our clinic review tool, if applicable. No additional management support is needed unless otherwise documented below in the visit note.   Chief Complaint  Patient presents with  . Mass    Under her rt armpit.  First noticed one week ago.  Has pain.    HPI: Patient comes in today for SDA for  new problem evaluation.  Nodule  Under arms in past now painful and going down arm. Worse over the last few days and chose to come in instead of  Going back to school cause is worse.  No fever chills at this time used ibuprofen .  And tyenol no other rx .  Thyroid is doing better .   Taking med plans to recheck a tspring breatk  ROS: See pertinent positives and negatives per HPI.  Past Medical History  Diagnosis Date  . Asthma     as a child quiescent  . History of varicella   . Scoliosis     fusion rod  2002  . Hyperthyroidism     rai ablation 8 12     Family History  Problem Relation Age of Onset  . GER disease Mother   . Hyperlipidemia Father   . Graves' disease Father   . Thyroid disease Father   . Crohn's disease Sister     History   Social History  . Marital Status: Single    Spouse Name: N/A    Number of Children: N/A  . Years of Education: N/A   Social History Main Topics  . Smoking status: Never Smoker   . Smokeless tobacco: None  . Alcohol Use: Yes     Comment: socially  . Drug Use: No  . Sexual Activity:    Other Topics Concern  . None   Social History Narrative   Single   HH of 4   Did one year at [employer]   [employer]  Transferred  to    [employer] .   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     Outpatient Encounter Prescriptions as of 10/28/2013  Medication Sig  . levothyroxine (SYNTHROID, LEVOTHROID) 150 MCG tablet Take 1 tablet (150 mcg total) by mouth daily before breakfast.  . sertraline (ZOLOFT) 50 MG tablet Take 1 tablet (50  mg total) by mouth daily.  . clindamycin (CLEOCIN) 300 MG capsule Take 1 capsule (300 mg total) by mouth 3 (three) times daily.  Marland Kitchen. HYDROcodone-acetaminophen (NORCO/VICODIN) 5-325 MG per tablet Take 1 tablet by mouth every 4 (four) hours as needed for moderate pain.    EXAM:  BP 96/60  Temp(Src) 98.4 F (36.9 C) (Oral)  Ht 5\' 3"  (1.6 m)  Wt 141 lb (63.957 kg)  BMI 24.98 kg/m2  Body mass index is 24.98 kg/(m^2).  GENERAL: vitals reviewed and listed above, alert, oriented, appears well hydrated and in no mild  Distress when moving right arm  HEENT: atraumatic, conjunctiva  clear, no obvious abnormalities on inspection of external nose and ears NECK: no obvious masses on inspection palpation . Right axilla  Cannot raise arm from pain  Linear ovoid 4-5 cm x 2 cm red warm right acilla  Some redness in shoulder   Left axilla is clear  Breast exam is normal CV: HRRR, no clubbing cyanosis or  peripheral edema nl cap refill  MS: moves all  extremities without noticeable focal  abnormality   Pain with elevation of right arm wont raise past  80 Degrees or so.  PSYCH: pleasant and cooperative, no obvious depression or anxiety  ASSESSMENT AND PLAN:  Discussed the following assessment and plan:  Abscess of axillary region - Plan: Ambulatory referral to General Surgery  Other postablative hypothyroidism Moderate size dec mibilkity of shoulder poss from pain but could be deeper plan surgery consult asap for iand d or other . Trying to avoid 2 procedures  Surgery cannot see her until tomorrow but if getting worse go to ed.  -Patient advised to return or notify health care team  if symptoms worsen or persist or new concerns arise.  Patient Instructions  This is an abscess  In a difficult place  And usually needs drainage and antibiotic to get better and keep from getting worse   Concern that the area could be tracking. appt with surgery tomorrow  In the meantime antibiotic and pain medication  But  if getting worse  Or fever   Go to the ed .    Neta Mends. Panosh M.D.

## 2013-10-29 ENCOUNTER — Ambulatory Visit (INDEPENDENT_AMBULATORY_CARE_PROVIDER_SITE_OTHER): Admitting: General Surgery

## 2013-10-29 ENCOUNTER — Encounter (INDEPENDENT_AMBULATORY_CARE_PROVIDER_SITE_OTHER): Payer: Self-pay | Admitting: General Surgery

## 2013-10-29 ENCOUNTER — Encounter (HOSPITAL_COMMUNITY): Payer: Self-pay | Admitting: *Deleted

## 2013-10-29 ENCOUNTER — Inpatient Hospital Stay (HOSPITAL_COMMUNITY)
Admission: AD | Admit: 2013-10-29 | Discharge: 2013-11-01 | DRG: 603 | Disposition: A | Discharge status: Home / Self Care | Source: Ambulatory Visit | Attending: General Surgery | Admitting: General Surgery

## 2013-10-29 VITALS — BP 100/68 | HR 80 | Temp 97.3°F | Resp 16 | Ht 62.0 in | Wt 139.4 lb

## 2013-10-29 DIAGNOSIS — L02419 Cutaneous abscess of limb, unspecified: Secondary | ICD-10-CM | POA: Diagnosis present

## 2013-10-29 DIAGNOSIS — J45909 Unspecified asthma, uncomplicated: Secondary | ICD-10-CM | POA: Diagnosis present

## 2013-10-29 DIAGNOSIS — Z79899 Other long term (current) drug therapy: Secondary | ICD-10-CM

## 2013-10-29 DIAGNOSIS — IMO0002 Reserved for concepts with insufficient information to code with codable children: Secondary | ICD-10-CM

## 2013-10-29 DIAGNOSIS — E039 Hypothyroidism, unspecified: Secondary | ICD-10-CM | POA: Diagnosis present

## 2013-10-29 LAB — CREATININE, SERUM
Creatinine, Ser: 0.7 mg/dL (ref 0.50–1.10)
GFR calc Af Amer: >90 mL/min (ref >90)
GFR calc non Af Amer: >90 mL/min (ref >90)

## 2013-10-29 MED ORDER — VANCOMYCIN HCL IN DEXTROSE 1-5 GM/200ML-% IV SOLN
1000.0000 mg | Freq: Two times a day (BID) | INTRAVENOUS | Status: DC
  Administered 2013-10-30: 1000 mg via INTRAVENOUS
  Filled 2013-10-29 (×3): qty 200

## 2013-10-29 MED ORDER — ONDANSETRON HCL 4 MG/2ML IJ SOLN
4.0000 mg | Freq: Four times a day (QID) | INTRAMUSCULAR | Status: DC | PRN
  Administered 2013-10-29 – 2013-11-01 (×5): 4 mg via INTRAVENOUS
  Filled 2013-10-29 (×5): qty 2

## 2013-10-29 MED ORDER — HYDROCODONE-ACETAMINOPHEN 5-325 MG PO TABS
1.0000 | ORAL_TABLET | ORAL | Status: DC | PRN
  Administered 2013-10-31 – 2013-11-01 (×3): 2 via ORAL
  Filled 2013-10-29 (×4): qty 2

## 2013-10-29 MED ORDER — SODIUM CHLORIDE 0.9 % IJ SOLN: 10.0000 mL | INTRAMUSCULAR | Status: DC | PRN

## 2013-10-29 MED ORDER — VANCOMYCIN HCL 1000 MG IV SOLR
1000.0000 mg | Freq: Once | INTRAVENOUS | Status: AC
  Administered 2013-10-29: 1000 mg via INTRAVENOUS
  Filled 2013-10-29: qty 1000

## 2013-10-29 MED ORDER — DEXTROSE-NACL 5-0.9 % IV SOLN
INTRAVENOUS | Status: DC
  Administered 2013-10-29 – 2013-11-01 (×4): via INTRAVENOUS

## 2013-10-29 MED ORDER — PIPERACILLIN-TAZOBACTAM 3.375 G IVPB
3.3750 g | Freq: Once | INTRAVENOUS | Status: AC
Start: 2013-10-29 — End: 2013-10-30
  Administered 2013-10-29: 3.375 g via INTRAVENOUS
  Filled 2013-10-29: qty 50

## 2013-10-29 MED ORDER — HYDROMORPHONE HCL PF 1 MG/ML IJ SOLN
1.0000 mg | INTRAMUSCULAR | Status: DC | PRN
  Administered 2013-10-30 – 2013-10-31 (×2): 1 mg via INTRAVENOUS
  Filled 2013-10-29 (×2): qty 1

## 2013-10-29 MED ORDER — PIPERACILLIN-TAZOBACTAM 3.375 G IVPB
3.3750 g | Freq: Three times a day (TID) | INTRAVENOUS | Status: DC
  Administered 2013-10-30 – 2013-11-01 (×7): 3.375 g via INTRAVENOUS
  Filled 2013-10-29 (×9): qty 50

## 2013-10-29 NOTE — Progress Notes (Signed)
Patient ID: Bethany Jefferson, female   DOB: 08-12-1991, 23 y.o.   MRN: [id number]  Chief Complaint  Patient presents with  . Wound Check    HPI Bethany Jefferson is a 23 y.o. female.  The pt is a 23 y/o F who presents today for an axillary abscess.  She states it began approximately 2 days ago.  She states that has becomebigger and become more tender. Patient is on clindamycin yesterday. It has started draining A large amount of purulence today.  Patient otherwise denies any fevers or chills at home.  HPI  Past Medical History  Diagnosis Date  . Asthma     as a child quiescent  . History of varicella   . Scoliosis     fusion rod  2002  . Hyperthyroidism     rai ablation 8 12     Past Surgical History  Procedure Laterality Date  . Spinal fusion  2002    [facility] [place]    Family History  Problem Relation Age of Onset  . GER disease Mother   . Hyperlipidemia Father   . Graves' disease Father   . Thyroid disease Father   . Crohn's disease Sister     Social History History  Substance Use Topics  . Smoking status: Never Smoker   . Smokeless tobacco: Not on file  . Alcohol Use: Yes     Comment: socially    No Known Allergies  Current Outpatient Prescriptions  Medication Sig Dispense Refill  . clindamycin (CLEOCIN) 300 MG capsule Take 1 capsule (300 mg total) by mouth 3 (three) times daily.  21 capsule  0  . HYDROcodone-acetaminophen (NORCO/VICODIN) 5-325 MG per tablet Take 1 tablet by mouth every 4 (four) hours as needed for moderate pain.  8 tablet  0  . levothyroxine (SYNTHROID, LEVOTHROID) 150 MCG tablet Take 1 tablet (150 mcg total) by mouth daily before breakfast.  30 tablet  11  . sertraline (ZOLOFT) 50 MG tablet Take 1 tablet (50 mg total) by mouth daily.  30 tablet  5   No current facility-administered medications for this visit.    Review of Systems Review of Systems  Constitutional: Negative.   HENT: Negative.   Respiratory: Negative.     Cardiovascular: Negative.   Gastrointestinal: Negative.   Neurological: Negative.   All other systems reviewed and are negative.    Blood pressure 100/68, pulse 80, temperature 97.3 F (36.3 C), temperature source Temporal, resp. rate 16, height 5\' 2"  (1.575 m), weight 139 lb 6.4 oz (63.231 kg).  Physical Exam Physical Exam  Constitutional: She is oriented to person, place, and time. She appears well-developed and well-nourished.  HENT:  Head: Normocephalic and atraumatic.  Eyes: Conjunctivae and EOM are normal. Pupils are equal, round, and reactive to light.  Neck: Normal range of motion. Neck supple.  Cardiovascular: Normal rate, regular rhythm and normal heart sounds.   Pulmonary/Chest: Effort normal and breath sounds normal.  Abdominal: Soft. Bowel sounds are normal. She exhibits no distension and no mass. There is no tenderness. There is no rebound and no guarding.  Musculoskeletal: Normal range of motion.  Neurological: She is alert and oriented to person, place, and time.  Skin: Skin is warm and dry.     Psychiatric: She has a normal mood and affect.    Data Reviewed none  Assessment    23 year old female with a right axillary abscess     Plan    1. We'll have the patient  direct admitted to the hospital for an I&D under anesthesia. 2. I discussed with the patient she would likely need IV antibiotics for 1-2 days as well as packing of being discharged from the hospital.        Marigene Ehlers., [facility] 10/29/2013, 2:47 PM

## 2013-10-29 NOTE — H&P (Signed)
HPI Bethany Jefferson is a 23 y.o. female.  The pt is a 23 y/o F who presents today for an axillary abscess.  She states it began approximately 2 days ago.  She states that has becomebigger and become more tender. Patient is on clindamycin yesterday. It has started draining A large amount of purulence today.  Patient otherwise denies any fevers or chills at home.  HPI  Past Medical History  Diagnosis Date  . Asthma     as a child quiescent  . History of varicella   . Scoliosis     fusion rod  2002  . Hyperthyroidism     rai ablation 8 12     Past Surgical History  Procedure Laterality Date  . Spinal fusion  2002    Dr. Haywood Pao, rod    Family History  Problem Relation Age of Onset  . GER disease Mother   . Hyperlipidemia Father   . Graves' disease Father   . Thyroid disease Father   . Crohn's disease Sister     Social History History  Substance Use Topics  . Smoking status: Never Smoker   . Smokeless tobacco: Not on file  . Alcohol Use: Yes     Comment: socially    No Known Allergies  Current Outpatient Prescriptions  Medication Sig Dispense Refill  . clindamycin (CLEOCIN) 300 MG capsule Take 1 capsule (300 mg total) by mouth 3 (three) times daily.  21 capsule  0  . HYDROcodone-acetaminophen (NORCO/VICODIN) 5-325 MG per tablet Take 1 tablet by mouth every 4 (four) hours as needed for moderate pain.  8 tablet  0  . levothyroxine (SYNTHROID, LEVOTHROID) 150 MCG tablet Take 1 tablet (150 mcg total) by mouth daily before breakfast.  30 tablet  11  . sertraline (ZOLOFT) 50 MG tablet Take 1 tablet (50 mg total) by mouth daily.  30 tablet  5   No current facility-administered medications for this visit.    Review of Systems Review of Systems  Constitutional: Negative.   HENT: Negative.   Respiratory: Negative.   Cardiovascular: Negative.   Gastrointestinal: Negative.   Neurological: Negative.   All other systems reviewed and are  negative.    Blood pressure 100/68, pulse 80, temperature 97.3 F (36.3 C), temperature source Temporal, resp. rate 16, height 5\' 2"  (1.575 m), weight 139 lb 6.4 oz (63.231 kg).  Physical Exam Physical Exam  Constitutional: She is oriented to person, place, and time. She appears well-developed and well-nourished.  HENT:  Head: Normocephalic and atraumatic.  Eyes: Conjunctivae and EOM are normal. Pupils are equal, round, and reactive to light.  Neck: Normal range of motion. Neck supple.  Cardiovascular: Normal rate, regular rhythm and normal heart sounds.   Pulmonary/Chest: Effort normal and breath sounds normal.  Abdominal: Soft. Bowel sounds are normal. She exhibits no distension and no mass. There is no tenderness. There is no rebound and no guarding.  Musculoskeletal: Normal range of motion.  Neurological: She is alert and oriented to person, place, and time.  Skin: Skin is warm and dry.     Psychiatric: She has a normal mood and affect.    Data Reviewed none  Assessment    23 year old female with a right axillary abscess     Plan    1. We'll have the patient direct admitted to the hospital for an I&D under anesthesia. 2. I discussed with the patient she would likely need IV antibiotics for 1-2 days as well as packing of  being discharged from the hospital.

## 2013-10-29 NOTE — Progress Notes (Signed)
Peripherally Inserted Central Catheter/Midline Placement  The IV Nurse has discussed with the patient and/or persons authorized to consent for the patient, the purpose of this procedure and the potential benefits and risks involved with this procedure.  The benefits include less needle sticks, lab draws from the catheter and patient may be discharged home with the catheter.  Risks include, but not limited to, infection, bleeding, blood clot (thrombus formation), and puncture of an artery; nerve damage and irregular heat beat.  Alternatives to this procedure were also discussed.  PICC/Midline Placement Documentation        Timmothy Soursewman, Avion Kutzer Renee 10/29/2013, 7:54 PM

## 2013-10-29 NOTE — Progress Notes (Addendum)
ANTIBIOTIC CONSULT NOTE - INITIAL  Pharmacy Consult for Vancomycin/Zosyn Indication: Cellulitis  No Known Allergies  Patient Measurements: Height: 5\' 2"  (157.5 cm) Weight: 139 lb 5.3 oz (63.2 kg) IBW/kg (Calculated) : 50.1  Vital Signs: Temp: 97.3 F (36.3 C) (02/24 1437) Temp src: Temporal (02/24 1437) BP: 100/68 mmHg (02/24 1437) Pulse Rate: 80 (02/24 1437) Intake/Output from previous day:   Intake/Output from this shift:    Labs: No results found for this basename: WBC, HGB, PLT, LABCREA, CREATININE,  in the last 72 hours Estimated Creatinine Clearance: 94.7 ml/min (by C-G formula based on Cr of 0.8). No results found for this basename: VANCOTROUGH, VANCOPEAK, VANCORANDOM, GENTTROUGH, GENTPEAK, GENTRANDOM, TOBRATROUGH, TOBRAPEAK, TOBRARND, AMIKACINPEAK, AMIKACINTROU, AMIKACIN,  in the last 72 hours   Microbiology: No results found for this or any previous visit (from the past 720 hour(s)).  Medical History: Past Medical History  Diagnosis Date  . Asthma     as a child quiescent  . History of varicella   . Scoliosis     fusion rod  2002  . Hyperthyroidism     rai ablation 8 12     Medications:  Scheduled:  . piperacillin-tazobactam (ZOSYN)  IV  3.375 g Intravenous Once  . vancomycin (VANCOCIN) 1000 mg IVPB  1,000 mg Intravenous Once   Infusions:  . [START ON 10/30/2013] dextrose 5 % and 0.9% NaCl     Assessment:  [age] yr female with axillary abscess.  Started clindamycin as outpatient on 2/23.  To be admitted for I&D and need for IV antibiotics  IV Vancomycin and Zosyn ordered empirically for treatment of cellulitis  Scr unknown at this time; therefore will order initial Vancomycin and Zosyn doses and f/u Scr before writing standing regimens  Goal of Therapy:  Vancomycin trough level 10-15 mcg/ml  Plan:  Measure antibiotic drug levels at steady state Follow up culture results Zosyn 3.375gm IV x 1 Vancomycin 1000mg  IV x 1 Check Scr - f/u result then  order standing regimens for Vancomycin and Zosyn  Catelin Manthe, Joselyn GlassmanLeann Trefz, PharmD 10/29/2013,4:02 PM  ADDENDUM:  Scr = 0.7 with CrCl ~ 94 ml/min  PLAN:   Zosyn 3.375gm IV q8h (each dose infused over 4 hrs) Vancomycin 1gm IV q12h  Terrilee FilesLeann Keilee Denman, PharmD 10/29/13 @ 17:20

## 2013-10-30 ENCOUNTER — Encounter (HOSPITAL_COMMUNITY): Payer: Self-pay | Admitting: Certified Registered"

## 2013-10-30 ENCOUNTER — Telehealth (INDEPENDENT_AMBULATORY_CARE_PROVIDER_SITE_OTHER): Payer: Self-pay | Admitting: General Surgery

## 2013-10-30 ENCOUNTER — Encounter (HOSPITAL_COMMUNITY): Admission: AD | Disposition: A | Discharge status: Home / Self Care | Payer: Self-pay | Source: Ambulatory Visit

## 2013-10-30 ENCOUNTER — Observation Stay (HOSPITAL_COMMUNITY): Admitting: Anesthesiology

## 2013-10-30 ENCOUNTER — Encounter (HOSPITAL_COMMUNITY): Admitting: Anesthesiology

## 2013-10-30 DIAGNOSIS — IMO0002 Reserved for concepts with insufficient information to code with codable children: Secondary | ICD-10-CM

## 2013-10-30 HISTORY — PX: INCISION AND DRAINAGE ABSCESS: SHX5864

## 2013-10-30 LAB — SURGICAL PCR SCREEN
MRSA, PCR: NEGATIVE
Staphylococcus aureus: NEGATIVE

## 2013-10-30 SURGERY — INCISION AND DRAINAGE, ABSCESS
Anesthesia: General | Site: Axilla | Laterality: Right

## 2013-10-30 MED ORDER — MIDAZOLAM HCL 2 MG/2ML IJ SOLN
INTRAMUSCULAR | Status: AC
  Filled 2013-10-30: qty 2

## 2013-10-30 MED ORDER — FENTANYL CITRATE 0.05 MG/ML IJ SOLN
INTRAMUSCULAR | Status: DC | PRN
  Administered 2013-10-30 (×2): 50 ug via INTRAVENOUS

## 2013-10-30 MED ORDER — DEXAMETHASONE SODIUM PHOSPHATE 10 MG/ML IJ SOLN
INTRAMUSCULAR | Status: DC | PRN
  Administered 2013-10-30: 10 mg via INTRAVENOUS

## 2013-10-30 MED ORDER — PROPOFOL 10 MG/ML IV BOLUS
INTRAVENOUS | Status: DC | PRN
  Administered 2013-10-30: 50 mg via INTRAVENOUS
  Administered 2013-10-30: 150 mg via INTRAVENOUS

## 2013-10-30 MED ORDER — PROMETHAZINE HCL 25 MG/ML IJ SOLN
12.5000 mg | Freq: Once | INTRAMUSCULAR | Status: AC
  Administered 2013-10-30: 12.5 mg via INTRAVENOUS
  Filled 2013-10-30: qty 1

## 2013-10-30 MED ORDER — ALTEPLASE 2 MG IJ SOLR
2.0000 mg | Freq: Once | INTRAMUSCULAR | Status: DC
  Filled 2013-10-30: qty 2

## 2013-10-30 MED ORDER — BUPIVACAINE-EPINEPHRINE 0.25% -1:200000 IJ SOLN
INTRAMUSCULAR | Status: DC | PRN
  Administered 2013-10-30: 10 mL

## 2013-10-30 MED ORDER — VANCOMYCIN HCL IN DEXTROSE 750-5 MG/150ML-% IV SOLN
750.0000 mg | Freq: Three times a day (TID) | INTRAVENOUS | Status: DC
  Administered 2013-10-31 – 2013-11-01 (×5): 750 mg via INTRAVENOUS
  Filled 2013-10-30 (×7): qty 150

## 2013-10-30 MED ORDER — VANCOMYCIN HCL IN DEXTROSE 750-5 MG/150ML-% IV SOLN
750.0000 mg | Freq: Three times a day (TID) | INTRAVENOUS | Status: DC
  Administered 2013-10-30: 750 mg via INTRAVENOUS
  Filled 2013-10-30 (×6): qty 150

## 2013-10-30 MED ORDER — BUPIVACAINE-EPINEPHRINE PF 0.25-1:200000 % IJ SOLN
INTRAMUSCULAR | Status: AC
  Filled 2013-10-30: qty 30

## 2013-10-30 MED ORDER — LACTATED RINGERS IV SOLN
INTRAVENOUS | Status: DC | PRN
  Administered 2013-10-30: 12:00:00 via INTRAVENOUS

## 2013-10-30 MED ORDER — DEXAMETHASONE SODIUM PHOSPHATE 10 MG/ML IJ SOLN
INTRAMUSCULAR | Status: AC
  Filled 2013-10-30: qty 1

## 2013-10-30 MED ORDER — SODIUM CHLORIDE 0.9 % IR SOLN
Status: DC | PRN
  Administered 2013-10-30: 1000 mL

## 2013-10-30 MED ORDER — ONDANSETRON HCL 4 MG/2ML IJ SOLN
INTRAMUSCULAR | Status: AC
  Filled 2013-10-30: qty 2

## 2013-10-30 MED ORDER — ONDANSETRON HCL 4 MG/2ML IJ SOLN
INTRAMUSCULAR | Status: DC | PRN
  Administered 2013-10-30: 4 mg via INTRAVENOUS

## 2013-10-30 MED ORDER — LACTATED RINGERS IV SOLN: INTRAVENOUS | Status: DC

## 2013-10-30 MED ORDER — PIPERACILLIN-TAZOBACTAM 3.375 G IVPB
INTRAVENOUS | Status: AC
  Filled 2013-10-30: qty 50

## 2013-10-30 MED ORDER — FENTANYL CITRATE 0.05 MG/ML IJ SOLN: 25.0000 ug | INTRAMUSCULAR | Status: DC | PRN

## 2013-10-30 MED ORDER — PROMETHAZINE HCL 25 MG/ML IJ SOLN
12.5000 mg | INTRAMUSCULAR | Status: DC | PRN
  Filled 2013-10-30: qty 1

## 2013-10-30 MED ORDER — FENTANYL CITRATE 0.05 MG/ML IJ SOLN
INTRAMUSCULAR | Status: AC
  Filled 2013-10-30: qty 2

## 2013-10-30 MED ORDER — SERTRALINE HCL 50 MG PO TABS
50.0000 mg | ORAL_TABLET | Freq: Every day | ORAL | Status: DC
  Administered 2013-10-30 – 2013-11-01 (×3): 50 mg via ORAL
  Filled 2013-10-30 (×3): qty 1

## 2013-10-30 MED ORDER — LEVOTHYROXINE SODIUM 150 MCG PO TABS
150.0000 ug | ORAL_TABLET | Freq: Every day | ORAL | Status: DC
  Administered 2013-10-31 – 2013-11-01 (×2): 150 ug via ORAL
  Filled 2013-10-30 (×3): qty 1

## 2013-10-30 MED ORDER — PROPOFOL 10 MG/ML IV BOLUS
INTRAVENOUS | Status: AC
  Filled 2013-10-30: qty 20

## 2013-10-30 MED ORDER — ENOXAPARIN SODIUM 40 MG/0.4ML ~~LOC~~ SOLN
40.0000 mg | SUBCUTANEOUS | Status: DC
  Administered 2013-10-30: 40 mg via SUBCUTANEOUS
  Filled 2013-10-30 (×4): qty 0.4

## 2013-10-30 MED ORDER — MIDAZOLAM HCL 5 MG/5ML IJ SOLN
INTRAMUSCULAR | Status: DC | PRN
  Administered 2013-10-30: 2 mg via INTRAVENOUS

## 2013-10-30 SURGICAL SUPPLY — 36 items
BLADE SURG 15 STRL LF DISP TIS (BLADE) ×1 IMPLANT
BLADE SURG 15 STRL SS (BLADE) ×2
BNDG GAUZE ELAST 4 BULKY (GAUZE/BANDAGES/DRESSINGS) IMPLANT
DECANTER SPIKE VIAL GLASS SM (MISCELLANEOUS) IMPLANT
DRAPE LAPAROSCOPIC ABDOMINAL (DRAPES) IMPLANT
ELECT REM PT RETURN 9FT ADLT (ELECTROSURGICAL) ×3
ELECTRODE REM PT RTRN 9FT ADLT (ELECTROSURGICAL) ×1 IMPLANT
GAUZE PACKING IODOFORM 1/2 (PACKING) ×3 IMPLANT
GLOVE BIO SURGEON STRL SZ 6.5 (GLOVE) ×2 IMPLANT
GLOVE BIO SURGEONS STRL SZ 6.5 (GLOVE) ×1
GLOVE BIOGEL PI IND STRL 7.0 (GLOVE) ×1 IMPLANT
GLOVE BIOGEL PI IND STRL 7.5 (GLOVE) ×1 IMPLANT
GLOVE BIOGEL PI INDICATOR 7.0 (GLOVE) ×2
GLOVE BIOGEL PI INDICATOR 7.5 (GLOVE) ×2
GLOVE SURG SS PI 7.5 STRL IVOR (GLOVE) ×3 IMPLANT
GOWN SPEC L3 XXLG W/TWL (GOWN DISPOSABLE) ×3 IMPLANT
GOWN STRL REUS TWL 2XL XL LVL4 (GOWN DISPOSABLE) ×3 IMPLANT
KIT BASIN OR (CUSTOM PROCEDURE TRAY) ×3 IMPLANT
MANIFOLD NEPTUNE II (INSTRUMENTS) ×3 IMPLANT
NEEDLE HYPO 25X1 1.5 SAFETY (NEEDLE) ×3 IMPLANT
NS IRRIG 1000ML POUR BTL (IV SOLUTION) ×3 IMPLANT
PACK BASIC VI WITH GOWN DISP (CUSTOM PROCEDURE TRAY) ×3 IMPLANT
PAD ABD 8X10 STRL (GAUZE/BANDAGES/DRESSINGS) ×3 IMPLANT
PENCIL BUTTON HOLSTER BLD 10FT (ELECTRODE) ×3 IMPLANT
SPONGE GAUZE 4X4 12PLY (GAUZE/BANDAGES/DRESSINGS) ×3 IMPLANT
SPONGE LAP 18X18 X RAY DECT (DISPOSABLE) ×3 IMPLANT
SUT MNCRL AB 4-0 PS2 18 (SUTURE) IMPLANT
SUT VIC AB 3-0 SH 27 (SUTURE)
SUT VIC AB 3-0 SH 27XBRD (SUTURE) IMPLANT
SWAB COLLECTION DEVICE MRSA (MISCELLANEOUS) ×3 IMPLANT
SYR CONTROL 10ML LL (SYRINGE) ×3 IMPLANT
TAPE CLOTH SURG 4X10 WHT LF (GAUZE/BANDAGES/DRESSINGS) ×3 IMPLANT
TOWEL OR 17X26 10 PK STRL BLUE (TOWEL DISPOSABLE) ×3 IMPLANT
TUBE ANAEROBIC SPECIMEN COL (MISCELLANEOUS) ×3 IMPLANT
WATER STERILE IRR 1000ML POUR (IV SOLUTION) IMPLANT
YANKAUER SUCT BULB TIP NO VENT (SUCTIONS) ×3 IMPLANT

## 2013-10-30 NOTE — Progress Notes (Addendum)
Received patient from PACU, awake, alert and oriented x 4. IV LR infusing @100mls /hr. VSS  Dressing to rt axillary dry and intact Pulse +. CSM to rt arm "U". IV fluids changed to D 5 NS @125ml /hr. Pt voided 500 mls clear yellow urine. Call bell, telephone in close reach, patient settled comfortable to be. Mom at bedside.

## 2013-10-30 NOTE — Preoperative (Signed)
Beta Blockers   Reason not to administer Beta Blockers:Not Applicable 

## 2013-10-30 NOTE — Telephone Encounter (Signed)
Attempted to contact patient's mother regarding postoperative care.

## 2013-10-30 NOTE — Progress Notes (Signed)
ANTIBIOTIC CONSULT NOTE - FOLLOW UP  Pharmacy Consult for Zosyn, Vancomycin Indication: Cellulitis, abscess  No Known Allergies  Patient Measurements: Height: 5\' 2"  (157.5 cm) Weight: 139 lb 5.3 oz (63.2 kg) IBW/kg (Calculated) : 50.1  Vital Signs: Temp: 98.3 F (36.8 C) (02/25 1100) Temp src: Oral (02/25 1100) BP: 106/64 mmHg (02/25 1100) Pulse Rate: 71 (02/25 1100) Intake/Output from previous day: 02/Bethany 0701 - 02/25 0700 In: 902.5 [I.V.:802.5; IV Piggyback:100] Out: 950 [Urine:950]  Labs:  Recent Labs  02/Bethany/15 1616  CREATININE 0.70   Estimated Creatinine Clearance: 94.7 ml/min (by C-G formula based on Cr of 0.7).  Microbiology: Recent Results (from the past 720 hour(s))  SURGICAL PCR SCREEN     Status: None   Collection Time    10/30/13  5:21 AM      Result Value Ref Range Status   MRSA, PCR NEGATIVE  NEGATIVE Final   Staphylococcus aureus NEGATIVE  NEGATIVE Final   Comment:            The Xpert SA Assay (FDA     approved for NASAL specimens     in patients over Bethany years of age),     is one component of     a comprehensive surveillance     program.  Test performance has     been validated by [facility] for patients greater     than or equal to Bethany year old.     It is not intended     to diagnose infection nor to     guide or monitor treatment.    Anti-infectives   Start     Dose/Rate Route Frequency Ordered Stop   10/30/13 0800  [MAR Hold]  vancomycin (VANCOCIN) IVPB 1000 mg/200 mL premix     (On MAR Hold since 10/30/13 1122)   1,000 mg 200 mL/hr over 60 Minutes Intravenous Every 12 hours 02/Bethany/15 1719     10/30/13 0400  [MAR Hold]  piperacillin-tazobactam (ZOSYN) IVPB 3.375 g     (On MAR Hold since 10/30/13 1122)   3.375 g 12.5 mL/hr over 240 Minutes Intravenous Every 8 hours 02/Bethany/15 1719     02/Bethany/15 1600  vancomycin (VANCOCIN) 1,000 mg in sodium chloride 0.9 % 250 mL IVPB     1,000 mg 250 mL/hr over 60 Minutes Intravenous  Once 02/Bethany/15 1559  02/Bethany/15 2135   02/Bethany/15 1600  piperacillin-tazobactam (ZOSYN) IVPB 3.375 g     3.375 g 12.5 mL/hr over 240 Minutes Intravenous  Once 02/Bethany/15 1559 10/30/13 0035      Assessment: Bethany Jefferson admitted 2/Bethany with right axillary abscess.  She was started on PO Clindamycin as an outpatient on 2/23, but increased in size and tenderness with purulent discharge.  Pharmacy is consulted to dose Zosyn and vancomycin.  Tmax: Afebrile  WBCs: (no recent CBC)  Renal: Scr 0.7 (2/Bethany) with CrCl ~ 94 ml/min  Goal of Therapy:  Vancomycin trough level 15-20 mcg/ml  Plan:   Continue Zosyn 3.375g IV Q8H infused over 4hrs.  Increase to Vancomycin 750mg  IV q8h.  Measure Vanc trough at steady state.  Follow up renal fxn and culture results.  Lynann Beaverhristine Steel Kerney PharmD, BCPS Pager [phone] 10/30/2013 1:20 PM

## 2013-10-30 NOTE — Transfer of Care (Signed)
Immediate Anesthesia Transfer of Care Note  Patient: Bethany Jefferson  Procedure(s) Performed: Procedure(s) (LRB): INCISION AND DRAINAGE ABSCESS RIGHT AXILLARY (Right)  Patient Location: PACU  Anesthesia Type: General  Level of Consciousness: sedated, patient cooperative and responds to stimulation  Airway & Oxygen Therapy: Patient Spontanous Breathing and Patient connected to face mask oxgen  Post-op Assessment: Report given to PACU RN and Post -op Vital signs reviewed and stable  Post vital signs: Reviewed and stable  Complications: No apparent anesthesia complications

## 2013-10-30 NOTE — Op Note (Signed)
10/29/2013 - 10/30/2013  4:13 PM  PATIENT:  Bethany Jefferson  23 y.o. female  Patient Care Team: Madelin HeadingsWanda K Panosh, MD as PCP - General Romero BellingSean Ellison, MD as Attending Physician (Endocrinology)  PRE-OPERATIVE DIAGNOSIS:  right axillary abscess  POST-OPERATIVE DIAGNOSIS:  right axillary abscess  PROCEDURE:  Procedure(s): INCISION AND DRAINAGE ABSCESS RIGHT AXILLARY  SURGEON:  Surgeon(s): Romie LeveeAlicia Hollie Wojahn, MD  ASSISTANT: none   ANESTHESIA:   general  EBL:  Total I/O In: 1200 [I.V.:1000; IV Piggyback:200] Out: -   DRAINS: none   SPECIMEN:  Source of Specimen:  abscess  DISPOSITION OF SPECIMEN:  Micro  COUNTS:  YES  PLAN OF CARE: patient admitted  PATIENT DISPOSITION:  PACU - hemodynamically stable.  INDICATION: axillary abscess   OR FINDINGS: axillary abscess  DESCRIPTION: the patient was identified in the preoperative holding area and taken to the OR where they were laid Supine on the operating room table.  Gen. anesthesia was induced without difficulty. SCDs were also noted to be in place prior to the initiation of anesthesia.  The patient was then prepped and draped in the usual sterile fashion.  A surgical timeout was performed indicating the correct patient, procedure, positioning and need for preoperative antibiotics.   I began by evaluating the abscess cavity with a fistula probe. Once I identified the edges of the abscess cavity an incision was made using Bovie electrocautery to remove the top portion of the abscess cavity. Cultures were obtained from the base of the wound. The wound was then irrigated with normal saline. Hemostasis was achieved using direct pressure and a electrocautery. The wound was packed with iodoform dressing. A sterile dressing was applied over this. The patient was awakened from anesthesia and sent to post anesthesia care unit in stable condition.

## 2013-10-30 NOTE — Progress Notes (Signed)
UR completed. Patient changed to inpatient- requiring IV antibiotics and IVF 

## 2013-10-30 NOTE — Anesthesia Preprocedure Evaluation (Signed)
Anesthesia Evaluation  Patient identified by MRN, date of birth, ID band Patient awake    Reviewed: Allergy & Precautions, H&P , NPO status , Patient's Chart, lab work & pertinent test results  Airway Mallampati: II TM Distance: >3 FB Neck ROM: full    Dental no notable dental hx. (+) Teeth Intact, Dental Advisory Given   Pulmonary neg pulmonary ROS,  breath sounds clear to auscultation  Pulmonary exam normal       Cardiovascular Exercise Tolerance: Good negative cardio ROS  Rhythm:regular Rate:Normal     Neuro/Psych negative neurological ROS  negative psych ROS   GI/Hepatic negative GI ROS, Neg liver ROS,   Endo/Other  negative endocrine ROSHypothyroidism   Renal/GU negative Renal ROS  negative genitourinary   Musculoskeletal   Abdominal   Peds  Hematology negative hematology ROS (+)   Anesthesia Other Findings   Reproductive/Obstetrics negative OB ROS                          Anesthesia Physical Anesthesia Plan  ASA: II  Anesthesia Plan: General   Post-op Pain Management:    Induction: Intravenous  Airway Management Planned: LMA  Additional Equipment:   Intra-op Plan:   Post-operative Plan:   Informed Consent: I have reviewed the patients History and Physical, chart, labs and discussed the procedure including the risks, benefits and alternatives for the proposed anesthesia with the patient or authorized representative who has indicated his/her understanding and acceptance.   Dental Advisory Given  Plan Discussed with: CRNA and Surgeon  Anesthesia Plan Comments:         Anesthesia Quick Evaluation  

## 2013-10-30 NOTE — Progress Notes (Signed)
  Subjective: Low BP, however, it is being obtained in distal aspect of the right arm.  i repeated it which was 98/62.  She reported dizziness last night after receiving pain medication.  No other symptoms to suggest symptomatic hypotension.    Objective: Vital signs in last 24 hours: Temp:  [97.3 F (36.3 C)-98.2 F (36.8 C)] 97.7 F (36.5 C) (02/25 0526) Pulse Rate:  [60-80] 60 (02/25 0526) Resp:  [14-16] 14 (02/25 0526) BP: (83-100)/(53-68) 83/53 mmHg (02/25 0526) SpO2:  [97 %-100 %] 100 % (02/25 0526) Weight:  [139 lb 5.3 oz (63.2 kg)-139 lb 6.4 oz (63.231 kg)] 139 lb 5.3 oz (63.2 kg) (02/24 1555) Last BM Date: 10/29/13  Intake/Output from previous day: 02/24 0701 - 02/25 0700 In: 902.5 [I.V.:802.5; IV Piggyback:100] Out: 950 [Urine:950] Intake/Output this shift: Total I/O In: 200 [IV Piggyback:200] Out: -   PE General appearance: alert, cooperative and no distress Resp: clear to auscultation bilaterally Cardio: regular rate and rhythm, S1, S2 normal, no murmur, click, rub or gallop GI: soft, non-tender; bowel sounds normal; no masses,  no organomegaly Right axillae-dressing in place  Lab Results:  No results found for this basename: WBC, HGB, HCT, PLT,  in the last 72 hours BMET  Recent Labs  10/29/13 1616  CREATININE 0.70   PT/INR No results found for this basename: LABPROT, INR,  in the last 72 hours ABG No results found for this basename: PHART, PCO2, PO2, HCO3,  in the last 72 hours  Studies/Results: No results found.  Anti-infectives: Anti-infectives   Start     Dose/Rate Route Frequency Ordered Stop   10/30/13 0800  vancomycin (VANCOCIN) IVPB 1000 mg/200 mL premix     1,000 mg 200 mL/hr over 60 Minutes Intravenous Every 12 hours 10/29/13 1719     10/30/13 0400  piperacillin-tazobactam (ZOSYN) IVPB 3.375 g     3.375 g 12.5 mL/hr over 240 Minutes Intravenous Every 8 hours 10/29/13 1719     10/29/13 1600  vancomycin (VANCOCIN) 1,000 mg in sodium  chloride 0.9 % 250 mL IVPB     1,000 mg 250 mL/hr over 60 Minutes Intravenous  Once 10/29/13 1559 10/29/13 2135   10/29/13 1600  piperacillin-tazobactam (ZOSYN) IVPB 3.375 g     3.375 g 12.5 mL/hr over 240 Minutes Intravenous  Once 10/29/13 1559 10/30/13 0035      Assessment/Plan: Right axillary abscess -OR this AM -consent signed, on atbx, NPO -Increase IVF -Pain control -VTE prophylaxis-lovenox to start tonight unless otherwise contraindicated following procedure today and SCDs.   LOS: 1 day    Saman Umstead ANP-BC 10/30/2013 9:35 AM

## 2013-10-30 NOTE — Progress Notes (Signed)
ATTENDING ADDENDUM:  I personally reviewed patient's record, examined the patient, and formulated the following assessment and plan:  OR today for drainage of abscess.  Cont IV abx for now

## 2013-10-31 ENCOUNTER — Encounter: Payer: Self-pay | Admitting: Internal Medicine

## 2013-10-31 LAB — CBC
HCT: 32.4 % — ABNORMAL LOW (ref 36.0–46.0)
Hemoglobin: 10.5 g/dL — ABNORMAL LOW (ref 12.0–15.0)
MCH: 27.6 pg (ref 26.0–34.0)
MCHC: 32.4 g/dL (ref 30.0–36.0)
MCV: 85.3 fL (ref 78.0–100.0)
Platelets: 353 10*3/uL (ref 150–400)
RBC: 3.8 MIL/uL — ABNORMAL LOW (ref 3.87–5.11)
RDW: 14.4 % (ref 11.5–15.5)
WBC: 10.8 10*3/uL — ABNORMAL HIGH (ref 4.0–10.5)

## 2013-10-31 LAB — BASIC METABOLIC PANEL
BUN: 5 mg/dL — ABNORMAL LOW (ref 6–23)
CALCIUM: 9.3 mg/dL (ref 8.4–10.5)
CO2: 25 mEq/L (ref 19–32)
CREATININE: 0.68 mg/dL (ref 0.50–1.10)
Chloride: 104 mEq/L (ref 96–112)
GFR calc Af Amer: >90 mL/min (ref >90)
Glucose, Bld: 127 mg/dL — ABNORMAL HIGH (ref 70–99)
Potassium: 4.1 mEq/L (ref 3.7–5.3)
Sodium: 139 mEq/L (ref 137–147)

## 2013-10-31 MED ORDER — HYDROCODONE-ACETAMINOPHEN 5-325 MG PO TABS: 1.0000 | ORAL_TABLET | ORAL | Status: DC | PRN

## 2013-10-31 MED ORDER — CLINDAMYCIN HCL 300 MG PO CAPS: 300.0000 mg | ORAL_CAPSULE | Freq: Three times a day (TID) | ORAL | Status: DC

## 2013-10-31 NOTE — Anesthesia Postprocedure Evaluation (Signed)
  Anesthesia Post-op Note  Patient: Bethany Jefferson  Procedure(s) Performed: Procedure(s) (LRB): INCISION AND DRAINAGE ABSCESS RIGHT AXILLARY (Right)  Patient Location: PACU  Anesthesia Type: General  Level of Consciousness: awake and alert   Airway and Oxygen Therapy: Patient Spontanous Breathing  Post-op Pain: mild  Post-op Assessment: Post-op Vital signs reviewed, Patient's Cardiovascular Status Stable, Respiratory Function Stable, Patent Airway and No signs of Nausea or vomiting  Last Vitals:  Filed Vitals:   10/31/13 0543  BP: 92/52  Pulse: 57  Temp: 36.6 C  Resp: 14    Post-op Vital Signs: stable   Complications: No apparent anesthesia complications

## 2013-10-31 NOTE — Discharge Instructions (Signed)
Dressing Change °A dressing is a material placed over wounds. It keeps the wound clean, dry, and protected from further injury. This provides an environment that favors wound healing.  °BEFORE YOU BEGIN °· Get your supplies together. Things you may need include: °· Saline solution. °· Flexible gauze dressing. °· Medicated cream. °· Tape. °· Gloves. °· Abdominal dressing pads. °· Gauze squares. °· Plastic bags. °· Take pain medicine 30 minutes before the dressing change if you need it. °· Take a shower before you do the first dressing change of the day. Use plastic wrap or a plastic bag to prevent the dressing from getting wet. °REMOVING YOUR OLD DRESSING  °· Wash your hands with soap and water. Dry your hands with a clean towel. °· Put on your gloves. °· Remove any tape. °· Carefully remove the old dressing. If the dressing sticks, you may dampen it with warm water to loosen it, or follow your caregiver's specific directions. °· Remove any gauze or packing tape that is in your wound. °· Take off your gloves. °· Put the gloves, tape, gauze, or any packing tape into a plastic bag. °CHANGING YOUR DRESSING °· Open the supplies. °· Take the cap off the saline solution. °· Open the gauze package so that the gauze remains on the inside of the package. °· Put on your gloves. °· Clean your wound as told by your caregiver. °· If you have been told to keep your wound dry, follow those instructions. °· Your caregiver may tell you to do one or more of the following: °· Pick up the gauze. Pour the saline solution over the gauze. Squeeze out the extra saline solution. °· Put medicated cream or other medicine on your wound if you have been told to do so. °· Put the solution soaked gauze only in your wound, not on the skin around it. °· Pack your wound loosely or as told by your caregiver. °· Put dry gauze on your wound. °· Put abdominal dressing pads over the dry gauze if your wet gauze soaks through. °· Tape the abdominal dressing  pads in place so they will not fall off. Do not wrap the tape completely around the affected part (arm, leg, abdomen). °· Wrap the dressing pads with a flexible gauze dressing to secure it in place. °· Take off your gloves. Put them in the plastic bag with the old dressing. Tie the bag shut and throw it away. °· Keep the dressing clean and dry until your next dressing change. °· Wash your hands. °SEEK MEDICAL CARE IF: °· Your skin around the wound looks red. °· Your wound feels more tender or sore. °· You see pus in the wound. °· Your wound smells bad. °· You have a fever. °· Your skin around the wound has a rash that itches and burns. °· You see black or yellow skin in your wound that was not there before. °· You feel nauseous, throw up, and feel very tired. °Document Released: 09/29/2004 Document Revised: 11/14/2011 Document Reviewed: 07/04/2011 °ExitCare® Patient Information ©2014 [employer]. ° °

## 2013-10-31 NOTE — Progress Notes (Signed)
ATTENDING ADDENDUM:  I personally reviewed patient's record, examined the patient, and formulated the following assessment and plan:  Pt having pain with dressing changes but otherwise doing better.  Can d/c once tolerating changes better.  Hopefully tomorrow.

## 2013-10-31 NOTE — Care Management Note (Signed)
    Page 1 of 1   10/31/2013     11:24:18 AM   CARE MANAGEMENT NOTE 10/31/2013  Patient:  Bethany Jefferson,Bethany Jefferson   Account Number:  [id number]  Date Initiated:  10/31/2013  Documentation initiated by:  Lorenda IshiharaPEELE,Xiomar Crompton  Subjective/Objective Assessment:   23 yo female admitted with breast abscess, s/p I&D. [facility].     Action/Plan:   Home with mother then return to school on Monday   Anticipated DC Date:  11/01/2013   Anticipated DC Plan:  HOME/SELF CARE      DC Planning Services  DC out of service area  Outpatient Services - Pt will follow up      Choice offered to / List presented to:  C-6 Parent           Status of service:  Completed, signed off Medicare Important Message given?   (If response is "NO", the following Medicare IM given date fields will be blank) Date Medicare IM given:   Date Additional Medicare IM given:    Discharge Disposition:  HOME/SELF CARE  Per UR Regulation:  Reviewed for med. necessity/level of care/duration of stay  If discussed at Long Length of Stay Meetings, dates discussed:    Comments:  10-31-13 Lorenda IshiharaSuzanne Ashlay Altieri RN CM 1100 Spoke with patient and mother at bedside. Patient will need continued wound care after d/c, she wishes to [facility]. Options for wound care would be at [place] Clinic [place] or with [facility]. Contacted clinic and spoke with Dr. Haze Justinarringer, he states they will be able to do dressing changes. Patient to contact him at the clinic when she returns to school on Monday. If she should need more wound care than they can provide then he will refer her to the Wound Clinic in [place]. Discussed plan with PA and patient, both are agreeable. Mother (who is an Char[employer] fundraiserN) to do wound care in the interim. No further HH needs identified.

## 2013-10-31 NOTE — Progress Notes (Signed)
Patient ID: Bethany Jefferson, female   DOB: 1991/04/13, 23 y.o.   MRN: [id number] 1 Day Post-Op  Subjective: Pt feels ok.  Having some pain.  Objective: Vital signs in last 24 hours: Temp:  [97.2 F (36.2 C)-98.3 F (36.8 C)] 97.9 F (36.6 C) (02/26 0543) Pulse Rate:  [57-94] 57 (02/26 0543) Resp:  [14-20] 14 (02/26 0543) BP: (90-139)/(52-78) 92/52 mmHg (02/26 0543) SpO2:  [96 %-100 %] 99 % (02/26 0543) Last BM Date: 10/29/13  Intake/Output from previous day: 02/25 0701 - 02/26 0700 In: 3330.8 [I.V.:2780.8; IV Piggyback:550] Out: 1250 [Urine:1250] Intake/Output this shift: Total I/O In: 360 [P.O.:360] Out: 300 [Urine:300]  PE: Skin: wound is clean with packing removed.  Minimal surrounding erythema, and minimal induration.  Wound repacked with 1/4 packing strip.  Lab Results:   Recent Labs  10/31/13 0540  WBC 10.8*  HGB 10.5*  HCT 32.4*  PLT 353   BMET  Recent Labs  10/29/13 1616 10/31/13 0540  NA  --  139  K  --  4.1  CL  --  104  CO2  --  25  GLUCOSE  --  127*  BUN  --  5*  CREATININE 0.70 0.68  CALCIUM  --  9.3   PT/INR No results found for this basename: LABPROT, INR,  in the last 72 hours CMP     Component Value Date/Time   NA 139 10/31/2013 0540   K 4.1 10/31/2013 0540   CL 104 10/31/2013 0540   CO2 25 10/31/2013 0540   GLUCOSE 127* 10/31/2013 0540   BUN 5* 10/31/2013 0540   CREATININE 0.68 10/31/2013 0540   CALCIUM 9.3 10/31/2013 0540   PROT 7.6 04/01/2013 1125   ALBUMIN 4.0 04/01/2013 1125   AST 17 04/01/2013 1125   ALT 11 04/01/2013 1125   ALKPHOS 37* 04/01/2013 1125   BILITOT 0.6 04/01/2013 1125   GFRNONAA >90 10/31/2013 0540   GFRAA >90 10/31/2013 0540   Lipase  No results found for this basename: lipase       Studies/Results: No results found.  Anti-infectives: Anti-infectives   Start     Dose/Rate Route Frequency Ordered Stop   10/31/13 0000  vancomycin (VANCOCIN) IVPB 750 mg/150 ml premix     750 mg 150 mL/hr over 60 Minutes  Intravenous Every 8 hours 10/30/13 2022     10/31/13 0000  clindamycin (CLEOCIN) 300 MG capsule     300 mg Oral 3 times daily 10/31/13 1005     10/30/13 1400  vancomycin (VANCOCIN) IVPB 750 mg/150 ml premix  Status:  Discontinued     750 mg 150 mL/hr over 60 Minutes Intravenous Every 8 hours 10/30/13 1324 10/30/13 2022   10/30/13 0800  [MAR Hold]  vancomycin (VANCOCIN) IVPB 1000 mg/200 mL premix  Status:  Discontinued     (On MAR Hold since 10/30/13 1122)   1,000 mg 200 mL/hr over 60 Minutes Intravenous Every 12 hours 10/29/13 1719 10/30/13 1324   10/30/13 0400  piperacillin-tazobactam (ZOSYN) IVPB 3.375 g     3.375 g 12.5 mL/hr over 240 Minutes Intravenous Every 8 hours 10/29/13 1719     10/29/13 1600  vancomycin (VANCOCIN) 1,000 mg in sodium chloride 0.9 % 250 mL IVPB     1,000 mg 250 mL/hr over 60 Minutes Intravenous  Once 10/29/13 1559 10/29/13 2135   10/29/13 1600  piperacillin-tazobactam (ZOSYN) IVPB 3.375 g     3.375 g 12.5 mL/hr over 240 Minutes Intravenous  Once 10/29/13 1559 10/30/13 0035  Assessment/Plan  1. POD 1. S/p I&D of right axillary abscess  Plan: 1. Dc home when patient able to tolerate dressing changes with just oral pain meds 2. Dc planning for home health in [place] vs using [facility] to assist with dressing changes.  LOS: 2 days    Demorio Seeley E 10/31/2013, 10:10 AM Pager: [phone]

## 2013-11-01 ENCOUNTER — Encounter (HOSPITAL_COMMUNITY): Payer: Self-pay | Admitting: General Surgery

## 2013-11-01 MED ORDER — SULFAMETHOXAZOLE-TMP DS 800-160 MG PO TABS: 1.0000 | ORAL_TABLET | Freq: Two times a day (BID) | ORAL | Status: DC

## 2013-11-01 NOTE — Progress Notes (Signed)
Per MD order, PICC line removed. Cath intact at 42cm. Vaseline pressure gauze to site, pressure held x 5min. No bleeding to site. Pt instructed to keep dressing CDI x 24 hours. Avoid heavy lifting, pushing or pulling x 24 hours,  If bleeding occurs hold pressure, if bleeding does not stop contact MD or go to the ED. Pt does not have any questions. Isiah Scheel M 

## 2013-11-01 NOTE — Discharge Summary (Signed)
Agree with above summary

## 2013-11-01 NOTE — Discharge Summary (Signed)
Physician Discharge Summary  [name] ZOX:[id number] DOB: [birth year] DOA: 10/29/2013  PCP: Lorretta HarpPANOSH,WANDA KOTVAN, MD  Consultation: none  Admit date: 10/29/2013 Discharge date: 11/01/2013  Recommendations for Outpatient Follow-up:   Follow-up Information   Follow up with [facility] On 11/12/2013. (2:00pm, arrive at 1:30pm for paperwork)    Contact information:   [address]   [place] [address] [phone]      Discharge Diagnoses:  1. Right axillary abscess   Surgical Procedure: Incision and drainage 10/30/13 Dr. Maisie Fushomas  Discharge Condition: stable Disposition: home  Diet recommendation: regular  Filed Weights   10/29/13 1555  Weight: 139 lb 5.3 oz (63.2 kg)     Filed Vitals:   11/01/13 0606  BP: 98/58  Pulse: 65  Temp: 97.1 F (36.2 C)  Resp: 16     Hospital Course:  Bethany Jefferson was admitted from our office with a right axillary abscess for incision and drainage on the OR.  She was placed and Vanc and zosyn.  She underwent and I&D the following day.  She was transferred to the floor post operatively.  On POD#1 the patient was having pain and therefore kept overnight for additional dressing instructions.  On POD#2 she was tolerating a diet, voiding, passing flatus and ambulating.  Her wound was clean, the patient and her mother were instructed on dressing care.  When she goes to school on Sunday she will be seen in the student clinic for dressing changes.  Her culture was negative after 2 days.  She was placed on septra at discharge.  She was given a prescription for pain medication.  She was provided with a follow up in our clinic in 2 weeks for a wound check.  She was encouraged to call with questions or concerns.   PE General appearance: alert and oriented. Calm and cooperative No acute distress. VSS. Afebrile.    Discharge Instructions     Medication List         clindamycin 300 MG capsule  Commonly known as:   CLEOCIN  Take 1 capsule (300 mg total) by mouth 3 (three) times daily.     HYDROcodone-acetaminophen 5-325 MG per tablet  Commonly known as:  NORCO/VICODIN  Take 1-2 tablets by mouth every 4 (four) hours as needed for moderate pain.     ibuprofen 200 MG tablet  Commonly known as:  ADVIL,MOTRIN  Take 800 mg by mouth every 6 (six) hours as needed (pain).     levothyroxine 150 MCG tablet  Commonly known as:  SYNTHROID, LEVOTHROID  Take 1 tablet (150 mcg total) by mouth daily before breakfast.     sertraline 50 MG tablet  Commonly known as:  ZOLOFT  Take 1 tablet (50 mg total) by mouth daily.           Follow-up Information   Follow up with [facility] On 11/12/2013. (2:00pm, arrive at 1:30pm for paperwork)    Contact information:   [address]   [place] [address] [phone]        The results of significant diagnostics from this hospitalization (including imaging, microbiology, ancillary and laboratory) are listed below for reference.    Significant Diagnostic Studies: No results found.  Microbiology: Recent Results (from the past 240 hour(s))  SURGICAL PCR SCREEN     Status: None   Collection Time    10/30/13  5:21 AM  Result Value Ref Range Status   MRSA, PCR NEGATIVE  NEGATIVE Final   Staphylococcus aureus NEGATIVE  NEGATIVE Final   Comment:            The Xpert SA Assay (FDA     approved for NASAL specimens     in patients over 41 years of age),     is one component of     a comprehensive surveillance     program.  Test performance has     been validated by [employer] for patients greater     than or equal to 40 year old.     It is not intended     to diagnose infection nor to     guide or monitor treatment.  CULTURE, ROUTINE-ABSCESS     Status: None   Collection Time    10/30/13  3:25 PM      Result Value Ref Range Status   Specimen Description ABSCESS RIGHT AXILLA   Final   Special Requests PATIENT ON  FOLLOWING ZOSYN VANCOMYCIN   Final   Gram Stain     Final   Value: FEW WBC PRESENT,BOTH PMN AND MONONUCLEAR     NO SQUAMOUS EPITHELIAL CELLS SEEN     NO ORGANISMS SEEN     Performed at [facility]   Culture     Final   Value: NO GROWTH 2 DAYS     Performed at [facility]   Report Status PENDING   Incomplete  ANAEROBIC CULTURE     Status: None   Collection Time    10/30/13  3:25 PM      Result Value Ref Range Status   Specimen Description ABSCESS RIGHT AXILLA   Final   Special Requests PATIENT ON FOLLOWING ZOSYN VANCOMYCIN   Final   Gram Stain     Final   Value: FEW WBC PRESENT,BOTH PMN AND MONONUCLEAR     NO SQUAMOUS EPITHELIAL CELLS SEEN     NO ORGANISMS SEEN     Performed at [facility]   Culture     Final   Value: NO ANAEROBES ISOLATED; CULTURE IN PROGRESS FOR 5 DAYS     Performed at [facility]   Report Status PENDING   Incomplete     Labs: Basic Metabolic Panel:  Recent Labs Lab 10/29/13 1616 10/31/13 0540  NA  --  139  K  --  4.1  CL  --  104  CO2  --  25  GLUCOSE  --  127*  BUN  --  5*  CREATININE 0.70 0.68  CALCIUM  --  9.3   Liver Function Tests: No results found for this basename: AST, ALT, ALKPHOS, BILITOT, PROT, ALBUMIN,  in the last 168 hours No results found for this basename: LIPASE, AMYLASE,  in the last 168 hours No results found for this basename: AMMONIA,  in the last 168 hours CBC:  Recent Labs Lab 10/31/13 0540  WBC 10.8*  HGB 10.5*  HCT 32.4*  MCV 85.3  PLT 353    Active Problems:   Axillary abscess   Signed:  Dnya Hickle, ANP-BC

## 2013-11-02 LAB — CULTURE, ROUTINE-ABSCESS: Culture: NO GROWTH

## 2013-11-03 ENCOUNTER — Encounter: Payer: Self-pay | Admitting: Internal Medicine

## 2013-11-04 LAB — ANAEROBIC CULTURE

## 2013-11-12 ENCOUNTER — Ambulatory Visit (INDEPENDENT_AMBULATORY_CARE_PROVIDER_SITE_OTHER): Admitting: General Surgery

## 2013-11-12 ENCOUNTER — Encounter (INDEPENDENT_AMBULATORY_CARE_PROVIDER_SITE_OTHER): Payer: Self-pay

## 2013-11-12 VITALS — BP 110/70 | HR 68 | Resp 16 | Ht 62.0 in | Wt 138.0 lb

## 2013-11-12 DIAGNOSIS — IMO0002 Reserved for concepts with insufficient information to code with codable children: Secondary | ICD-10-CM

## 2013-11-12 DIAGNOSIS — L02419 Cutaneous abscess of limb, unspecified: Secondary | ICD-10-CM

## 2013-11-12 NOTE — Patient Instructions (Signed)
Keep wound covered. No further packing needed. Follow up with infirmary in 1-2 weeks for a wound check

## 2013-11-12 NOTE — Progress Notes (Signed)
Sherrilee GillesBrianna S Dunson Nov 09, 1990 [id number]  This is a 23 yo female who underwent an I&D of a right axillary abscess by Dr. Romie LeveeAlicia Thomas on 10-30-13. She returns today for a follow up wound check.  She is doing very well.  She had been getting [facility].  PE: Skin: wound is almost healed.  .5x.5cm in size with 100% granulation tissue.  This is very shallow.  A/P: 1. I&D of right axillary abscess  This is healing very well.  She does not need any further dressing changes to her wound.  Since [place] is at school in [place], she will follow up with her infirmary on campus in 1-2 for a last wound check as she will not be able to make it back here during the week.  She is encouraged to call as needed.

## 2013-11-16 ENCOUNTER — Encounter (HOSPITAL_COMMUNITY): Payer: Self-pay | Admitting: Emergency Medicine

## 2013-11-16 ENCOUNTER — Emergency Department (INDEPENDENT_AMBULATORY_CARE_PROVIDER_SITE_OTHER)

## 2013-11-16 ENCOUNTER — Emergency Department (INDEPENDENT_AMBULATORY_CARE_PROVIDER_SITE_OTHER)
Admission: EM | Admit: 2013-11-16 | Discharge: 2013-11-16 | Disposition: A | Discharge status: Home / Self Care | Source: Home / Self Care | Attending: Family Medicine | Admitting: Family Medicine

## 2013-11-16 DIAGNOSIS — S6000XA Contusion of unspecified finger without damage to nail, initial encounter: Secondary | ICD-10-CM

## 2013-11-16 DIAGNOSIS — S60039A Contusion of unspecified middle finger without damage to nail, initial encounter: Secondary | ICD-10-CM

## 2013-11-16 NOTE — ED Provider Notes (Signed)
CSN: [id number]     Arrival date & time 11/16/13  1349 History   First MD Initiated Contact with Patient 11/16/13 1501     Chief Complaint  Patient presents with  . Finger Injury   (Consider location/radiation/quality/duration/timing/severity/associated sxs/prior Treatment) Patient is a 23 y.o. female presenting with hand pain. The history is provided by the patient.  Hand Pain This is a new problem. The current episode started yesterday (struck lmf against wall in dark at home last eve while dancing., still sore and swollen today.).    Past Medical History  Diagnosis Date  . Asthma     as a child quiescent  . History of varicella   . Scoliosis     fusion rod  2002  . Hyperthyroidism     rai ablation 8 12    Past Surgical History  Procedure Laterality Date  . Spinal fusion  [facility]    [facility] rod  . Incision and drainage abscess Right 10/30/2013    Procedure: INCISION AND DRAINAGE ABSCESS RIGHT AXILLARY;  Surgeon: Romie LeveeAlicia Thomas, MD;  Location: [facility];  Service: General;  Laterality: Right;   Family History  Problem Relation Age of Onset  . GER disease Mother   . Hyperlipidemia Father   . Graves' disease Father   . Thyroid disease Father   . Crohn's disease Sister    History  Substance Use Topics  . Smoking status: Never Smoker   . Smokeless tobacco: Never Used  . Alcohol Use: Yes     Comment: socially   OB History   Grav Para Term Preterm Abortions TAB SAB Ect Mult Living                 Review of Systems  Musculoskeletal: Positive for joint swelling.    Allergies  Review of patient's allergies indicates no known allergies.  Home Medications   Current Outpatient Rx  Name  Route  Sig  Dispense  Refill  . HYDROcodone-acetaminophen (NORCO/VICODIN) 5-325 MG per tablet   Oral   Take 1-2 tablets by mouth every 4 (four) hours as needed for moderate pain.   40 tablet   0   . ibuprofen (ADVIL,MOTRIN) 200 MG tablet   Oral   Take 800 mg  by mouth every 6 (six) hours as needed (pain).         Marland Kitchen. levothyroxine (SYNTHROID, LEVOTHROID) 150 MCG tablet   Oral   Take 1 tablet (150 mcg total) by mouth daily before breakfast.   30 tablet   11   . sertraline (ZOLOFT) 50 MG tablet   Oral   Take 1 tablet (50 mg total) by mouth daily.   30 tablet   5     PHARMACY:  PLEASE FILL FOR 6 MONTHS   . sulfamethoxazole-trimethoprim (BACTRIM DS) 800-160 MG per tablet   Oral   Take 1 tablet by mouth 2 (two) times daily.   14 tablet   0    BP 94/70  Pulse 74  Temp(Src) 97.9 F (36.6 C) (Oral)  Resp 18  SpO2 100%  LMP 10/24/2013 Physical Exam  ED Course  Procedures (including critical care time) Labs Review Labs Reviewed - No data to display Imaging Review Dg Finger Middle Left  11/16/2013   CLINICAL DATA:  Pain post trauma  EXAM: LEFT THIRD FINGER 2+V  COMPARISON:  None.  FINDINGS: Frontal, oblique, and lateral views were obtained. There is no fracture or dislocation. Joint spaces appear intact. No erosive change.  IMPRESSION: No abnormality noted.   Electronically Signed   By: Bretta Bang M.D.   On: 11/16/2013 15:30     MDM   1. Contusion of middle finger without damage to nail        Linna Hoff, MD 11/16/13 1557

## 2013-11-16 NOTE — Discharge Instructions (Signed)
Buddy tape for comfort, ice and advil for soreness, return as needed.

## 2013-11-16 NOTE — ED Notes (Signed)
Pt  inj  Her  l  Middle  Finger  Yesterday    -  She reports  She  Hit  A  Wall        By  Accident  -  She  Has  Pain swelling  And  Some  Minor  Bruising  Noted

## 2014-01-27 ENCOUNTER — Encounter: Payer: Self-pay | Admitting: Internal Medicine

## 2014-01-29 MED ORDER — LEVOTHYROXINE SODIUM 150 MCG PO TABS: 150.0000 ug | ORAL_TABLET | Freq: Every day | ORAL | Status: DC

## 2014-01-29 NOTE — Addendum Note (Signed)
Addended by: Raj Janus T on: 01/29/2014 09:04 AM   Modules accepted: Orders

## 2014-01-29 NOTE — Telephone Encounter (Signed)
Sent to the pharmacy by e-scribe. 

## 2014-01-30 ENCOUNTER — Telehealth: Payer: Self-pay | Admitting: Internal Medicine

## 2014-01-30 NOTE — Telephone Encounter (Signed)
Patient Information:  Caller Name: Carlota  Phone: [phone]  Patient: Bethany Jefferson  Gender: Female  DOB: [birth year]  Age: 23 Years  PCP: Berniece Andreas [facility])  Pregnant: No  Office Follow Up:  Does the office need to follow up with this patient?: No  Instructions For The Office: N/A  RN Note:  Will obtain appt. for this pt. Pt. is not having any acute symptoms now and prefers to wait until her Dr. can see her. Advised that is fine, but if starts to have the same pain again needs to call and have the appt. moved to 01/30/14. Pt. is scheduled at 09:45am with Dr. Fabian Sharp on 01/31/14  at her request.  Symptoms  Reason For Call & Symptoms: Within the last few days, noticed chest pain on the Left side. States she is fine now, but on having the pain, also feels tightness. Pain does not radiate. No SOB. Having palpitations.  Reviewed Health History In EMR: Yes  Reviewed Medications In EMR: Yes  Reviewed Allergies In EMR: Yes  Reviewed Surgeries / Procedures: Yes  Date of Onset of Symptoms: 01/24/2014  Treatments Tried: ASA  Treatments Tried Worked: No OB / GYN:  LMP: 01/22/2014  Guideline(s) Used:  Chest Pain  Disposition Per Guideline:   See Today in Office  Reason For Disposition Reached:   Intermittent chest pains persist > 3 days  Advice Given:  Call Back If:  Constant chest pain lasting longer than 5 minutes  Difficulty breathing  You become worse.  Patient Will Follow Care Advice:  YES  Appointment Scheduled:  01/31/2014 09:45:00 Appointment Scheduled Provider:  Berniece Andreas [facility])

## 2014-01-31 ENCOUNTER — Ambulatory Visit (INDEPENDENT_AMBULATORY_CARE_PROVIDER_SITE_OTHER): Admitting: Internal Medicine

## 2014-01-31 ENCOUNTER — Encounter: Payer: Self-pay | Admitting: Internal Medicine

## 2014-01-31 VITALS — BP 96/70 | Temp 98.6°F | Ht 63.0 in | Wt 144.0 lb

## 2014-01-31 DIAGNOSIS — R002 Palpitations: Secondary | ICD-10-CM

## 2014-01-31 DIAGNOSIS — F4323 Adjustment disorder with mixed anxiety and depressed mood: Secondary | ICD-10-CM

## 2014-01-31 DIAGNOSIS — Z79899 Other long term (current) drug therapy: Secondary | ICD-10-CM

## 2014-01-31 DIAGNOSIS — E890 Postprocedural hypothyroidism: Secondary | ICD-10-CM

## 2014-01-31 DIAGNOSIS — R079 Chest pain, unspecified: Secondary | ICD-10-CM

## 2014-01-31 LAB — CBC WITH DIFFERENTIAL/PLATELET
Basophils Absolute: 0 10*3/uL (ref 0.0–0.1)
Basophils Relative: 0.8 % (ref 0.0–3.0)
EOS ABS: 0.4 10*3/uL (ref 0.0–0.7)
EOS PCT: 9.1 % — AB (ref 0.0–5.0)
HCT: 35.8 % — ABNORMAL LOW (ref 36.0–46.0)
HEMOGLOBIN: 11.6 g/dL — AB (ref 12.0–15.0)
LYMPHS ABS: 1.7 10*3/uL (ref 0.7–4.0)
Lymphocytes Relative: 37.5 % (ref 12.0–46.0)
MCHC: 32.4 g/dL (ref 30.0–36.0)
MCV: 86.3 fl (ref 78.0–100.0)
MONO ABS: 0.4 10*3/uL (ref 0.1–1.0)
Monocytes Relative: 8 % (ref 3.0–12.0)
NEUTROS PCT: 44.6 % (ref 43.0–77.0)
Neutro Abs: 2 10*3/uL (ref 1.4–7.7)
PLATELETS: 355 10*3/uL (ref 150.0–400.0)
RBC: 4.15 Mil/uL (ref 3.87–5.11)
RDW: 15.6 % — ABNORMAL HIGH (ref 11.5–15.5)
WBC: 4.5 10*3/uL (ref 4.0–10.5)

## 2014-01-31 LAB — BASIC METABOLIC PANEL
BUN: 10 mg/dL (ref 6–23)
CO2: 28 mEq/L (ref 19–32)
Calcium: 9.5 mg/dL (ref 8.4–10.5)
Chloride: 105 mEq/L (ref 96–112)
Creatinine, Ser: 0.8 mg/dL (ref 0.4–1.2)
GFR: 106.79 mL/min (ref >60.00)
Glucose, Bld: 69 mg/dL — ABNORMAL LOW (ref 70–99)
Potassium: 4.2 mEq/L (ref 3.5–5.1)
SODIUM: 137 meq/L (ref 135–145)

## 2014-01-31 LAB — HEPATIC FUNCTION PANEL
ALBUMIN: 4.1 g/dL (ref 3.5–5.2)
ALT: 10 U/L (ref 0–35)
AST: 15 U/L (ref 0–37)
Alkaline Phosphatase: 44 U/L (ref 39–117)
BILIRUBIN DIRECT: 0.1 mg/dL (ref 0.0–0.3)
TOTAL PROTEIN: 7.7 g/dL (ref 6.0–8.3)
Total Bilirubin: 0.4 mg/dL (ref 0.2–1.2)

## 2014-01-31 LAB — T4, FREE: FREE T4: 0.96 ng/dL (ref 0.60–1.60)

## 2014-01-31 LAB — TSH: TSH: 27.07 u[IU]/mL — ABNORMAL HIGH (ref 0.35–4.50)

## 2014-01-31 LAB — T3, FREE: T3, Free: 2.5 pg/mL (ref 2.3–4.2)

## 2014-01-31 NOTE — Patient Instructions (Signed)
Exam is good today  Checking labs to see if related to your sx . ekg has non specific t wave changes  but no evidence of an acute heart problem . Will notify you  of labs when available. If recurring problem we can do further work up . Call us about this .   Sometimes anxiety or  thryoid could do this .

## 2014-01-31 NOTE — Progress Notes (Signed)
Chief Complaint  Patient presents with  . Chest Pain  . Palpitations    HPI: Patient comes in today for SDA for  new problem evaluation. Bethany Jefferson has hx of post ablative hpothyroidism, reactive mood isses on  zoloft and mild anemia who comes in today for recent  Episodes  palipiations and short chest pain  .lasts  Minutes  .  No cough . but  Scares her  .  No syncope associated sx  Pain is like twinging when over . Last tsh ?  Awhile back.trakiong med every day  Without missing and without  Other food or meds.   This semeseter doing ok   Therapy individual.  On campus. Mood stable  Going back on Monday summer .  School  To be back in July  Neg td  Soc etoh rare caffinen fam hx  Fathers  Cousin?  Heart problem .   No first degree relatives  ROS: See pertinent positives and negatives per HPI.no fever racing trauma NVD     No recurrence of abscess  Axillary periods nol lmp recently no risk of pregnancy reported   Past Medical History  Diagnosis Date  . Asthma     as a child quiescent  . History of varicella   . Scoliosis     fusion rod  2002  . Hyperthyroidism     rai ablation 8 12     Family History  Problem Relation Age of Onset  . GER disease Mother   . Hyperlipidemia Father   . Graves' disease Father   . Thyroid disease Father   . Crohn's disease Sister     History   Social History  . Marital Status: Single    Spouse Name: N/A    Number of Children: N/A  . Years of Education: N/A   Social History Main Topics  . Smoking status: Never Smoker   . Smokeless tobacco: Never Used  . Alcohol Use: Yes     Comment: socially  . Drug Use: No  . Sexual Activity: None   Other Topics Concern  . None   Social History Narrative   Single   HH of 4   Did one year at [employer]  Transferred  to    [place] .   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      Outpatient Encounter Prescriptions as of 01/31/2014  Medication Sig  . levothyroxine (SYNTHROID, LEVOTHROID) 150 MCG tablet Take 1 tablet (150 mcg total) by mouth daily before breakfast.  . sertraline (ZOLOFT) 50 MG tablet Take 1 tablet (50 mg total) by mouth daily.  . [DISCONTINUED] HYDROcodone-acetaminophen (NORCO/VICODIN) 5-325 MG per tablet Take 1-2 tablets by mouth every 4 (four) hours as needed for moderate pain.  . [DISCONTINUED] ibuprofen (ADVIL,MOTRIN) 200 MG tablet Take 800 mg by mouth every 6 (six) hours as needed (pain).  . [DISCONTINUED] sulfamethoxazole-trimethoprim (BACTRIM DS) 800-160 MG per tablet Take 1 tablet by mouth 2 (two) times daily.    EXAM:  BP 96/70  Temp(Src) 98.6 F (37 C) (Oral)  Ht 5\' 3"  (1.6 m)  Wt 144 lb (65.318 kg)  BMI 25.51 kg/m2  Body mass index is 25.51 kg/(m^2).  GENERAL: vitals reviewed and listed above, alert, oriented, appears well hydrated and in no acute distress HEENT: atraumatic, conjunctiva  clear, no  obvious abnormalities on inspection of external nose and ears OP : no lesion edema or exudate  NECK: no obvious masses on inspection palpation  Thyroid palpable no nodules  ? Cp tenderness at T3 cc junction   LUNGS: clear to auscultation bilaterally, no wheezes, rales or rhonchi, good air movement CV: HRRR, S1 S2 nkl no g or m no clubbing cyanosis or  peripheral edema nl cap refill  Abdomen:  Sof,t normal bowel sounds without hepatosplenomegaly, no guarding rebound or masses no CVA tenderness Bethany: moves all extremities without noticeable focal  abnormality PSYCH: pleasant and cooperative, no obvious depression or anxiety Neuro no tremor  Gross non focal  EKG NSR except diffuse  No  flat t waves  No elevation or depression Rate 63  Nl intervals    ASSESSMENT AND PLAN:  Discussed the following assessment and plan:  Palpitations - short lived with atypical cp left  - Plan: EKG 12-Lead, Basic metabolic panel, CBC with Differential, TSH,  T4, free, T3, free, Hepatic function panel  Chest pain - Plan: EKG 12-Lead, Basic metabolic panel, CBC with Differential, TSH, T4, free, T3, free, Hepatic function panel  Other postablative hypothyroidism - last tsh not in range  - Plan: Basic metabolic panel, CBC with Differential, TSH, T4, free, T3, free, Hepatic function panel  ADJUSTMENT DISORDER WITH MIXED FEATURES - improved  continuing - Plan: Basic metabolic panel, CBC with Differential, TSH, T4, free, T3, free, Hepatic function panel  Medication management - Plan: Basic metabolic panel, CBC with Differential, TSH, T4, free, T3, free, Hepatic function panel Labs today we'll inform her of results if persistent progressive consider other evaluation such as echo and/or cardiology opinion.  Certainly if her TSH is off this could cause the palpitations.` -Patient advised to return or notify health care team  if symptoms worsen ,persist or new concerns arise.  Patient Instructions  Exam is good today  Checking labs to see if related to your sx . ekg has non specific t wave changes  but no evidence of an acute heart problem . Will notify you  of labs when available. If recurring problem we can do further work up . Call us about this .   Sometimes anxiety or  thryoid could do this .   Neta MendsWanda K. Panosh M.D.  Pre visit review using our clinic review tool, if applicable. No additional management support is needed unless otherwise documented below in the visit note.

## 2014-02-10 ENCOUNTER — Telehealth: Payer: Self-pay | Admitting: Internal Medicine

## 2014-02-10 MED ORDER — LEVOTHYROXINE SODIUM 175 MCG PO TABS: 175.0000 ug | ORAL_TABLET | Freq: Every day | ORAL | Status: DC

## 2014-02-10 NOTE — Telephone Encounter (Signed)
Patient notified to pick up new rx at the pharmacy.  She has a follow up appointment on 03/18/14.  Will make a lab appt at that time.

## 2014-02-10 NOTE — Telephone Encounter (Signed)
Pt called to say that on my chart Dr Fabian Sharp was changing her   levothyroxine (SYNTHROID, LEVOTHROID) 150 MCG tablet to 175 mcg she contacted the pharmacy and there was no rx please advise

## 2014-02-10 NOTE — Telephone Encounter (Signed)
Pt called to say that according to my chart her

## 2014-03-18 ENCOUNTER — Ambulatory Visit (INDEPENDENT_AMBULATORY_CARE_PROVIDER_SITE_OTHER): Admitting: Internal Medicine

## 2014-03-18 ENCOUNTER — Encounter: Payer: Self-pay | Admitting: Internal Medicine

## 2014-03-18 VITALS — BP 96/60 | Temp 98.9°F | Ht 64.0 in | Wt 145.0 lb

## 2014-03-18 DIAGNOSIS — Z79899 Other long term (current) drug therapy: Secondary | ICD-10-CM

## 2014-03-18 DIAGNOSIS — F4323 Adjustment disorder with mixed anxiety and depressed mood: Secondary | ICD-10-CM

## 2014-03-18 DIAGNOSIS — L723 Sebaceous cyst: Secondary | ICD-10-CM

## 2014-03-18 DIAGNOSIS — L089 Local infection of the skin and subcutaneous tissue, unspecified: Secondary | ICD-10-CM

## 2014-03-18 DIAGNOSIS — E890 Postprocedural hypothyroidism: Secondary | ICD-10-CM

## 2014-03-18 DIAGNOSIS — L729 Follicular cyst of the skin and subcutaneous tissue, unspecified: Secondary | ICD-10-CM

## 2014-03-18 DIAGNOSIS — R002 Palpitations: Secondary | ICD-10-CM

## 2014-03-18 MED ORDER — DOXYCYCLINE HYCLATE 100 MG PO TABS: 100.0000 mg | ORAL_TABLET | Freq: Two times a day (BID) | ORAL | Status: DC

## 2014-03-18 NOTE — Patient Instructions (Addendum)
Warm compresses antibiotic and let the area drain and should get better on its own.Bethany Jefferson. Glad you are  feeling better her heart sounds normal. Please get blood test in 1-  2 weeks that we'll be your thyroid test TSH.

## 2014-03-18 NOTE — Progress Notes (Signed)
Pre visit review using our clinic review tool, if applicable. No additional management support is needed unless otherwise documented below in the visit note.  Chief Complaint  Patient presents with  . Follow-up    Also complains of a painful boil in her groin area.  Palpitations thyroid    HPI: Patient comes in today for follow up of multiple medical problems.  Post ablative hypothyroidism last TSH was still elevated increased dose of levothyroxine since last month has been on it about a month to 5 weeks and feels better  Palpitations have decreased since her last visit.  Anxiety mood doing much better she is in a social support group for her social anxiety continues on her Zoloft. Is looking forward to go back to school for her last year.  Is getting recurrent boils and sores in the GU area surgeon who trained her axillary abscess said that she may have recurrent boils and skin condition she's had a pain. Tender spot on the right upper by but it is getting smaller. No fever noted. ROS: See pertinent positives and negatives per HPI.no current chest pain shortness of breath bruising or bleeding.  Past Medical History  Diagnosis Date  . Asthma     as a child quiescent  . History of varicella   . Scoliosis     fusion rod  2002  . Hyperthyroidism     rai ablation 8 12     Family History  Problem Relation Age of Onset  . GER disease Mother   . Hyperlipidemia Father   . Graves' disease Father   . Thyroid disease Father   . Crohn's disease Sister     History   Social History  . Marital Status: Single    Spouse Name: N/A    Number of Children: N/A  . Years of Education: N/A   Social History Main Topics  . Smoking status: Never Smoker   . Smokeless tobacco: Never Used  . Alcohol Use: Yes     Comment: socially  . Drug Use: No  . Sexual Activity: None   Other Topics Concern  . None   Social History Narrative   Single   HH of 4   Did one year at [employer]   [employer]  [place]  to    [place] .   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     Outpatient Encounter Prescriptions as of 03/18/2014  Medication Sig  . levothyroxine (SYNTHROID, LEVOTHROID) 175 MCG tablet Take 1 tablet (175 mcg total) by mouth daily before breakfast.  . sertraline (ZOLOFT) 50 MG tablet Take 1 tablet (50 mg total) by mouth daily.  Marland Kitchen. doxycycline (VIBRA-TABS) 100 MG tablet Take 1 tablet (100 mg total) by mouth 2 (two) times daily.    EXAM:  BP 96/60  Temp(Src) 98.9 F (37.2 C) (Oral)  Ht 5\' 4"  (1.626 m)  Wt 145 lb (65.772 kg)  BMI 24.88 kg/m2  Body mass index is 24.88 kg/(m^2).  GENERAL: vitals reviewed and listed above, alert, oriented, appears well hydrated and in no acute distress HEENT: atraumatic, conjunctiva  clear, no obvious abnormalities on inspection of external nose and ears OP : no lesion edema or exudate  NECK: no obvious masses on inspection palpation  LUNGS: clear to auscultation bilaterally, no wheezes, rales or rhonchi,  CV: HRRR, no clubbing cyanosis  or  peripheral edema nl cap refill  MS: moves all extremities without noticeable focal  Abnormality Ext gu right upper medial thight there is a 1.5 cm nodule with central opening looks cystic. Expressed discharge and some serous pus-looking and debris. PSYCH: pleasant and cooperative, no obvious depression or anxiety Lab Results  Component Value Date   WBC 4.5 01/31/2014   HGB 11.6* 01/31/2014   HCT 35.8* 01/31/2014   PLT 355.0 01/31/2014   GLUCOSE 69* 01/31/2014   CHOL 169 04/01/2013   TRIG 51.0 04/01/2013   HDL 54.10 04/01/2013   LDLCALC 105* 04/01/2013   ALT 10 01/31/2014   AST 15 01/31/2014   NA 137 01/31/2014   K 4.2 01/31/2014   CL 105 01/31/2014   CREATININE 0.8 01/31/2014   BUN 10 01/31/2014   CO2 28 01/31/2014   TSH 27.07* 01/31/2014    ASSESSMENT AND PLAN:  Discussed the following assessment and  plan:  Other postablative hypothyroidism - TSH still not in range apparently feels better check level in another week or 2  Palpitations - Better question anxiety thyroid no further workup at this time unless recurs.  Adjustment disorder with mixed anxiety and depressed mood - Doing better with medication counseling and support groups  Medication management  Infected cyst of skin - Right thigh appears to be draining local care expectant management short course antibiotic history recurrent boils -Patient advised to return or notify health care team  if symptoms worsen ,persist or new concerns arise. Plan fu dependin onlabs  And or at break  Patient Instructions  Warm compresses antibiotic and let the area drain and should get better on its own.Bethany Jefferson you are  feeling better her heart sounds normal. Please get blood test in 1-  2 weeks that we'll be your thyroid test TSH.   Neta Mends. Bethany Jefferson M.D.

## 2014-04-01 ENCOUNTER — Other Ambulatory Visit (INDEPENDENT_AMBULATORY_CARE_PROVIDER_SITE_OTHER)

## 2014-04-01 DIAGNOSIS — E890 Postprocedural hypothyroidism: Secondary | ICD-10-CM

## 2014-04-01 LAB — TSH: TSH: 3.15 u[IU]/mL (ref 0.35–4.50)

## 2014-04-03 ENCOUNTER — Encounter: Payer: Self-pay | Admitting: Physician Assistant

## 2014-04-03 ENCOUNTER — Ambulatory Visit (INDEPENDENT_AMBULATORY_CARE_PROVIDER_SITE_OTHER): Admitting: Physician Assistant

## 2014-04-03 VITALS — BP 94/64 | HR 72 | Temp 98.4°F | Resp 18 | Wt 143.0 lb

## 2014-04-03 DIAGNOSIS — L509 Urticaria, unspecified: Secondary | ICD-10-CM

## 2014-04-03 NOTE — Progress Notes (Signed)
Subjective:    Patient ID: Bethany Jefferson, female    DOB: 1991-08-13, 22 y.o.   MRN: [id number]  Rash This is a new problem. The current episode started yesterday. The problem has been gradually worsening since onset. The affected locations include the neck, abdomen, left arm, right arm, left upper leg and right upper leg. The rash is characterized by itchiness and redness (raised). She was exposed to a new medication (Doxycycline recently for folliculitis, rash didnt appear until after this was completed.). Pertinent negatives include no anorexia, congestion, cough, diarrhea, eye pain, facial edema, fatigue, fever, joint pain, nail changes, rhinorrhea, shortness of breath, sore throat or vomiting. Treatments tried: some anti-itch ointment, and benadryl. The treatment provided mild relief. There is no history of allergies, asthma or eczema.      Review of Systems  Constitutional: Negative for fever, chills and fatigue.  HENT: Negative for congestion, facial swelling, rhinorrhea, sore throat and trouble swallowing.   Eyes: Negative for pain.  Respiratory: Negative for cough and shortness of breath.   Cardiovascular: Negative for chest pain.  Gastrointestinal: Negative for nausea, vomiting, abdominal pain, diarrhea and anorexia.  Musculoskeletal: Negative for joint pain.  Skin: Positive for rash. Negative for nail changes.  Neurological: Negative for syncope and headaches.  All other systems reviewed and are negative.  Past Medical History  Diagnosis Date  . Asthma     as a child quiescent  . History of varicella   . Scoliosis     fusion rod  2002  . Hyperthyroidism     rai ablation 8 12     History   Social History  . Marital Status: Single    Spouse Name: N/A    Number of Children: N/A  . Years of Education: N/A   Occupational History  . Not on file.   Social History Main Topics  . Smoking status: Never Smoker   . Smokeless tobacco: Never Used  . Alcohol Use: Yes    Comment: socially  . Drug Use: No  . Sexual Activity: Not on file   Other Topics Concern  . Not on file   Social History Narrative   Single   HH of 4   Did one year at [employer]  Transferred  to    [place] .   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     Past Surgical History  Procedure Laterality Date  . Spinal fusion  2002    [facility]. [facility], rod  . Incision and drainage abscess Right 10/30/2013    Procedure: INCISION AND DRAINAGE ABSCESS RIGHT AXILLARY;  Surgeon: Romie Levee, MD;  Location: [facility];  Service: General;  Laterality: Right;    Family History  Problem Relation Age of Onset  . GER disease Mother   . Hyperlipidemia Father   . Graves' disease Father   . Thyroid disease Father   . Crohn's disease Sister     No Known Allergies  Current Outpatient Prescriptions on File Prior to Visit  Medication Sig Dispense Refill  . levothyroxine (SYNTHROID, LEVOTHROID) 175 MCG tablet Take 1 tablet (175 mcg total) by mouth daily before breakfast.  90 tablet  0  . sertraline (ZOLOFT) 50 MG tablet Take 1 tablet (50 mg total) by mouth daily.  30 tablet  5   No current  facility-administered medications on file prior to visit.    EXAM: BP 94/64  Pulse 72  Temp(Src) 98.4 F (36.9 C) (Oral)  Resp 18  Wt 143 lb (64.864 kg)     Objective:   Physical Exam  Nursing note and vitals reviewed. Constitutional: She is oriented to person, place, and time. She appears well-developed and well-nourished. No distress.  HENT:  Head: Normocephalic and atraumatic.  Eyes: Conjunctivae and EOM are normal. Pupils are equal, round, and reactive to light.  Cardiovascular: Normal rate, regular rhythm and intact distal pulses.   Pulmonary/Chest: Effort normal and breath sounds normal. No respiratory distress.  Neurological: She is alert and oriented to person,  place, and time.  Skin: Skin is warm and dry. She is not diaphoretic.  Diffuse hives over bilateral upper arms and bilateral anterior thighs.  No surrounding erythema, no swelling, no pain to palpation, no fluctuance, no puncture.  Psychiatric: She has a normal mood and affect. Her behavior is normal. Judgment and thought content normal.     Lab Results  Component Value Date   WBC 4.5 01/31/2014   HGB 11.6* 01/31/2014   HCT 35.8* 01/31/2014   PLT 355.0 01/31/2014   GLUCOSE 69* 01/31/2014   CHOL 169 04/01/2013   TRIG 51.0 04/01/2013   HDL 54.10 04/01/2013   LDLCALC 105* 04/01/2013   ALT 10 01/31/2014   AST 15 01/31/2014   NA 137 01/31/2014   K 4.2 01/31/2014   CL 105 01/31/2014   CREATININE 0.8 01/31/2014   BUN 10 01/31/2014   CO2 28 01/31/2014   TSH 3.15 04/01/2014        Assessment & Plan:  Bethany Jefferson was seen today for rash.  Diagnoses and associated orders for this visit:  Hives Comments: Itching. Use topical anti-itch. Maximize antihistamine use with H2 blocker. Watchful waiting. Try to identifiy and eliminate cause from environment.    Histamine block with OTC antihistamine and Zantac.   If symptoms persist despite above management, will consider prednisone taper.  Return precautions provided, and patient handout on hives.  Plan to follow up as needed, or for worsening or persistent symptoms despite treatment.  Patient Instructions  Let's start therapy by giving you on a daily antihistamine such as over-the-counter Allegra, over-the-counter Claritin, or over-the-counter Zyrtec. Zyrtec is slightly sedating similar to Benadryl, so is best to take this at bedtime. Do not take Benadryl the same time as these medications.  Also try taking over-the-counter Zantac to block all histamine production.  Continue using over-the-counter hydrocortisone cream to help itching symptoms. Cool compresses or cold running water also help relieve the itch.  If the symptoms persist despite the above  treatment, we may try a short course of prednisone, however usually it is not indicated.  If emergency symptoms discussed during visit developed, seek medical attention immediately.  Followup as needed, or for worsening or persistent symptoms despite treatment.

## 2014-04-03 NOTE — Progress Notes (Signed)
Pre visit review using our clinic review tool, if applicable. No additional management support is needed unless otherwise documented below in the visit note. 

## 2014-04-03 NOTE — Patient Instructions (Addendum)
Let's start therapy by giving you on a daily antihistamine such as over-the-counter Allegra, over-the-counter Claritin, or over-the-counter Zyrtec. Zyrtec is slightly sedating similar to Benadryl, so is best to take this at bedtime. Do not take Benadryl the same time as these medications.  Also try taking over-the-counter Zantac to block all histamine production.  Continue using over-the-counter hydrocortisone cream to help itching symptoms. Cool compresses or cold running water also help relieve the itch.  If the symptoms persist despite the above treatment, we may try a short course of prednisone, however usually it is not indicated.  If emergency symptoms discussed during visit developed, seek medical attention immediately.  Followup as needed, or for worsening or persistent symptoms despite treatment.     Hives Hives are itchy, red, puffy (swollen) areas of the skin. Hives can change in size and location on your body. Hives can come and go for hours, days, or weeks. Hives do not spread from person to person (noncontagious). Scratching, exercise, and stress can make your hives worse. HOME CARE  Avoid things that cause your hives (triggers).  Take antihistamine medicines as told by your doctor. Do not drive while taking an antihistamine.  Take any other medicines for itching as told by your doctor.  Wear loose-fitting clothing.  Keep all doctor visits as told. GET HELP RIGHT AWAY IF:   You have a fever.  Your tongue or lips are puffy.  You have trouble breathing or swallowing.  You feel tightness in the throat or chest.  You have belly (abdominal) pain.  You have lasting or severe itching that is not helped by medicine.  You have painful or puffy joints. These problems may be the first sign of a life-threatening allergic reaction. Call your local emergency services (911 in [place] MAKE SURE YOU:   Understand these instructions.  Will watch your condition.  Will get  help right away if you are not doing well or get worse. Document Released: 05/31/2008 Document Revised: 02/21/2012 Document Reviewed: 11/15/2011 [facility] Patient Information 2015 [employer] [place]. This information is not intended to replace advice given to you by your health care provider. Make sure you discuss any questions you have with your health care provider.

## 2014-04-04 ENCOUNTER — Telehealth: Payer: Self-pay | Admitting: Internal Medicine

## 2014-04-04 NOTE — Telephone Encounter (Signed)
Pt returning call about labs  

## 2014-04-07 ENCOUNTER — Encounter: Payer: Self-pay | Admitting: Family Medicine

## 2014-04-08 NOTE — Telephone Encounter (Signed)
Patient notified of labs on 04/07/14

## 2014-06-11 ENCOUNTER — Encounter: Payer: Self-pay | Admitting: Internal Medicine

## 2014-08-16 ENCOUNTER — Other Ambulatory Visit: Payer: Self-pay | Admitting: Internal Medicine

## 2014-08-18 ENCOUNTER — Other Ambulatory Visit: Payer: Self-pay | Admitting: Internal Medicine

## 2014-08-18 ENCOUNTER — Telehealth: Payer: Self-pay | Admitting: Family Medicine

## 2014-08-18 ENCOUNTER — Other Ambulatory Visit: Payer: Self-pay | Admitting: Family Medicine

## 2014-08-18 DIAGNOSIS — E890 Postprocedural hypothyroidism: Secondary | ICD-10-CM

## 2014-08-18 NOTE — Telephone Encounter (Signed)
Pt should have lab work and a follow up visit in Jan.  Please help the pt to get on the schedule.  Thanks! Order has been placed in the system.

## 2014-08-18 NOTE — Telephone Encounter (Signed)
Sent to the pharmacy by e-scribe.  Pt is due for lab work and a follow up visit.  Will send a message to scheduling to help the pt make an appt.

## 2014-08-19 MED ORDER — LEVOTHYROXINE SODIUM 175 MCG PO TABS: 175.0000 ug | ORAL_TABLET | Freq: Every day | ORAL | Status: DC

## 2014-08-19 NOTE — Telephone Encounter (Signed)
Pt has been sch

## 2014-09-08 ENCOUNTER — Other Ambulatory Visit (INDEPENDENT_AMBULATORY_CARE_PROVIDER_SITE_OTHER)

## 2014-09-08 DIAGNOSIS — E890 Postprocedural hypothyroidism: Secondary | ICD-10-CM

## 2014-09-08 LAB — TSH: TSH: 0.81 u[IU]/mL (ref 0.35–4.50)

## 2014-09-11 ENCOUNTER — Ambulatory Visit (INDEPENDENT_AMBULATORY_CARE_PROVIDER_SITE_OTHER): Admitting: Internal Medicine

## 2014-09-11 ENCOUNTER — Encounter: Payer: Self-pay | Admitting: Internal Medicine

## 2014-09-11 VITALS — BP 104/76 | Temp 98.1°F | Ht 64.0 in | Wt 143.1 lb

## 2014-09-11 DIAGNOSIS — Z79899 Other long term (current) drug therapy: Secondary | ICD-10-CM

## 2014-09-11 DIAGNOSIS — Z23 Encounter for immunization: Secondary | ICD-10-CM

## 2014-09-11 DIAGNOSIS — Z309 Encounter for contraceptive management, unspecified: Secondary | ICD-10-CM

## 2014-09-11 DIAGNOSIS — E039 Hypothyroidism, unspecified: Secondary | ICD-10-CM

## 2014-09-11 DIAGNOSIS — Z30011 Encounter for initial prescription of contraceptive pills: Secondary | ICD-10-CM

## 2014-09-11 DIAGNOSIS — F4323 Adjustment disorder with mixed anxiety and depressed mood: Secondary | ICD-10-CM

## 2014-09-11 MED ORDER — NORETHINDRONE ACET-ETHINYL EST 1-20 MG-MCG PO TABS: 1.0000 | ORAL_TABLET | Freq: Every day | ORAL | Status: DC

## 2014-09-11 MED ORDER — SERTRALINE HCL 50 MG PO TABS: 50.0000 mg | ORAL_TABLET | Freq: Every day | ORAL | Status: DC

## 2014-09-11 NOTE — Patient Instructions (Addendum)
Continue same thyroid medication. Agree staying on the zoloft for now . Get to counseling .   Locally as we discussed .   Can begin ocps as discussed     Oral Contraception Use Oral contraceptive pills (OCPs) are medicines taken to prevent pregnancy. OCPs work by preventing the ovaries from releasing eggs. The hormones in OCPs also cause the cervical mucus to thicken, preventing the sperm from entering the uterus. The hormones also cause the uterine lining to become thin, not allowing a fertilized egg to attach to the inside of the uterus. OCPs are highly effective when taken exactly as prescribed. However, OCPs do not prevent sexually transmitted diseases (STDs). Safe sex practices, such as using condoms along with an OCP, can help prevent STDs. Before taking OCPs, you may have a physical exam and Pap test. Your health care provider may also order blood tests if necessary. Your health care provider will make sure you are a good candidate for oral contraception. Discuss with your health care provider the possible side effects of the OCP you may be prescribed. When starting an OCP, it can take 2 to 3 months for the body to adjust to the changes in hormone levels in your body.  HOW TO TAKE ORAL CONTRACEPTIVE PILLS Your health care provider may advise you on how to start taking the first cycle of OCPs. Otherwise, you can:   Start on day 1 of your menstrual period. You will not need any backup contraceptive protection with this start time.   Start on the first Sunday after your menstrual period or the day you get your prescription. In these cases, you will need to use backup contraceptive protection for the first week.   Start the pill at any time of your cycle. If you take the pill within 5 days of the start of your period, you are protected against pregnancy right away. In this case, you will not need a backup form of birth control. If you start at any other time of your menstrual cycle, you will  need to use another form of birth control for 7 days. If your OCP is the type called a minipill, it will protect you from pregnancy after taking it for 2 days (48 hours). After you have started taking OCPs:   If you forget to take 1 pill, take it as soon as you remember. Take the next pill at the regular time.   If you miss 2 or more pills, call your health care provider because different pills have different instructions for missed doses. Use backup birth control until your next menstrual period starts.   If you use a 28-day pack that contains inactive pills and you miss 1 of the last 7 pills (pills with no hormones), it will not matter. Throw away the rest of the non-hormone pills and start a new pill pack.  No matter which day you start the OCP, you will always start a new pack on that same day of the week. Have an extra pack of OCPs and a backup contraceptive method available in case you miss some pills or lose your OCP pack.  HOME CARE INSTRUCTIONS   Do not smoke.   Always use a condom to protect against STDs. OCPs do not protect against STDs.   Use a calendar to mark your menstrual period days.   Read the information and directions that came with your OCP. Talk to your health care provider if you have questions.  SEEK MEDICAL CARE IF:  You develop nausea and vomiting.   You have abnormal vaginal discharge or bleeding.   You develop a rash.   You miss your menstrual period.   You are losing your hair.   You need treatment for mood swings or depression.   You get dizzy when taking the OCP.   You develop acne from taking the OCP.   You become pregnant.  SEEK IMMEDIATE MEDICAL CARE IF:   You develop chest pain.   You develop shortness of breath.   You have an uncontrolled or severe headache.   You develop numbness or slurred speech.   You develop visual problems.   You develop pain, redness, and swelling in the legs.  Document Released:  08/11/2011 Document Revised: 01/06/2014 Document Reviewed: 02/10/2013 [facility] Patient Information 2015 [place], [place]. This information is not intended to replace advice given to you by your health care provider. Make sure you discuss any questions you have with your health care provider.

## 2014-09-11 NOTE — Progress Notes (Signed)
Pre visit review using our clinic review tool, if applicable. No additional management support is needed unless otherwise documented below in the visit note.  Chief Complaint  Patient presents with  . Follow-up    thyroid med disc ocp    HPI: Bethany Jefferson 23 y.o.  Has post ablative hypothyroid and under replacement therapy  Taking med  Without problem  Mood and medication stopped the zoloft  And then began not going to  Class  And jsut got notified has to take off a semester  Because of academic problamtion    One class attendance hasnt told parents yet.   Restarted zoloft with help has counselor ast school   Not here  Need infor for support hjere . 21 hours from graduation.  Interested in ocps  Not sa  Disc neg hx clots no tobacco  peridos some irreg  ROS: See pertinent positives and negatives per HPI.  Past Medical History  Diagnosis Date  . Asthma     as a child quiescent  . History of varicella   . Scoliosis     fusion rod  2002  . Hyperthyroidism     rai ablation 8 12     Family History  Problem Relation Age of Onset  . GER disease Mother   . Hyperlipidemia Father   . Graves' disease Father   . Thyroid disease Father   . Crohn's disease Sister     History   Social History  . Marital Status: Single    Spouse Name: N/A    Number of Children: N/A  . Years of Education: N/A   Social History Main Topics  . Smoking status: Never Smoker   . Smokeless tobacco: Never Used  . Alcohol Use: Yes     Comment: socially  . Drug Use: No  . Sexual Activity: None   Other Topics Concern  . None   Social History Narrative   Single   HH of 4   Did one year at [employer]   [employer]  Transferred  to    [employer] .   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     Outpatient Encounter Prescriptions as of 09/11/2014  Medication Sig  . levothyroxine (SYNTHROID, LEVOTHROID) 175  MCG tablet Take 1 tablet (175 mcg total) by mouth daily.  . sertraline (ZOLOFT) 50 MG tablet Take 1 tablet (50 mg total) by mouth daily.  . [DISCONTINUED] sertraline (ZOLOFT) 50 MG tablet Take 1 tablet (50 mg total) by mouth daily.  . norethindrone-ethinyl estradiol (MICROGESTIN) 1-20 MG-MCG tablet Take 1 tablet by mouth daily.    EXAM:  BP 104/76 mmHg  Temp(Src) 98.1 F (36.7 C) (Oral)  Ht 5\' 4"  (1.626 m)  Wt 143 lb 1.6 oz (64.91 kg)  BMI 24.55 kg/m2  Body mass index is 24.55 kg/(m^2).  GENERAL: vitals reviewed and listed above, alert, oriented, appears well hydrated and in no acute distress HEENT: atraumatic, conjunctiva  clear, no obvious abnormalities on inspection of external nose and ears PSYCH: pleasant and cooperative, somewhat down related to discussion Lab Results  Component Value Date   WBC 4.5 01/31/2014   HGB 11.6* 01/31/2014   HCT 35.8* 01/31/2014   PLT 355.0 01/31/2014   GLUCOSE 69* 01/31/2014   CHOL 169 04/01/2013   TRIG 51.0 04/01/2013   HDL 54.10 04/01/2013  LDLCALC 105* 04/01/2013   ALT 10 01/31/2014   AST 15 01/31/2014   NA 137 01/31/2014   K 4.2 01/31/2014   CL 105 01/31/2014   CREATININE 0.8 01/31/2014   BUN 10 01/31/2014   CO2 28 01/31/2014   TSH 0.81 09/08/2014    ASSESSMENT AND PLAN:  Discussed the following assessment and plan:  Hypothyroidism, unspecified hypothyroidism type - same dose  Adjustment disorder with mixed anxiety and depressed mood - stopped med and feels led to her academic failure from lack of attendance  back on med seek local counselor stay on med for now  OCP (oral contraceptive pills) initiation - counseled  acceptable candidate fu 4mos  Medication management  Need for prophylactic vaccination and inoculation against influenza - Plan: Flu Vaccine QUAD 36+ mos PF IM (Fluarix Quad PF) Total visit > 50% spent counseling and coordinating care   -Patient advised to return or notify health care team  if symptoms  worsen ,persist or new concerns arise.  Patient Instructions  Continue same thyroid medication. Agree staying on the zoloft for now . Get to counseling .   Locally as we discussed .   Can begin ocps as discussed     Oral Contraception Use Oral contraceptive pills (OCPs) are medicines taken to prevent pregnancy. OCPs work by preventing the ovaries from releasing eggs. The hormones in OCPs also cause the cervical mucus to thicken, preventing the sperm from entering the uterus. The hormones also cause the uterine lining to become thin, not allowing a fertilized egg to attach to the inside of the uterus. OCPs are highly effective when taken exactly as prescribed. However, OCPs do not prevent sexually transmitted diseases (STDs). Safe sex practices, such as using condoms along with an OCP, can help prevent STDs. Before taking OCPs, you may have a physical exam and Pap test. Your health care provider may also order blood tests if necessary. Your health care provider will make sure you are a good candidate for oral contraception. Discuss with your health care provider the possible side effects of the OCP you may be prescribed. When starting an OCP, it can take 2 to 3 months for the body to adjust to the changes in hormone levels in your body.  HOW TO TAKE ORAL CONTRACEPTIVE PILLS Your health care provider may advise you on how to start taking the first cycle of OCPs. Otherwise, you can:   Start on day 1 of your menstrual period. You will not need any backup contraceptive protection with this start time.   Start on the first Sunday after your menstrual period or the day you get your prescription. In these cases, you will need to use backup contraceptive protection for the first week.   Start the pill at any time of your cycle. If you take the pill within 5 days of the start of your period, you are protected against pregnancy right away. In this case, you will not need a backup form of birth control.  If you start at any other time of your menstrual cycle, you will need to use another form of birth control for 7 days. If your OCP is the type called a minipill, it will protect you from pregnancy after taking it for 2 days (48 hours). After you have started taking OCPs:   If you forget to take 1 pill, take it as soon as you remember. Take the next pill at the regular time.   If you miss 2 or more pills,  call your health care provider because different pills have different instructions for missed doses. Use backup birth control until your next menstrual period starts.   If you use a 28-day pack that contains inactive pills and you miss 1 of the last 7 pills (pills with no hormones), it will not matter. Throw away the rest of the non-hormone pills and start a new pill pack.  No matter which day you start the OCP, you will always start a new pack on that same day of the week. Have an extra pack of OCPs and a backup contraceptive method available in case you miss some pills or lose your OCP pack.  HOME CARE INSTRUCTIONS   Do not smoke.   Always use a condom to protect against STDs. OCPs do not protect against STDs.   Use a calendar to mark your menstrual period days.   Read the information and directions that came with your OCP. Talk to your health care provider if you have questions.  SEEK MEDICAL CARE IF:   You develop nausea and vomiting.   You have abnormal vaginal discharge or bleeding.   You develop a rash.   You miss your menstrual period.   You are losing your hair.   You need treatment for mood swings or depression.   You get dizzy when taking the OCP.   You develop acne from taking the OCP.   You become pregnant.  SEEK IMMEDIATE MEDICAL CARE IF:   You develop chest pain.   You develop shortness of breath.   You have an uncontrolled or severe headache.   You develop numbness or slurred speech.   You develop visual problems.   You develop  pain, redness, and swelling in the legs.  Document Released: 08/11/2011 Document Revised: 01/06/2014 Document Reviewed: 02/10/2013 [facility] Patient Information 2015 [place], [place]. This information is not intended to replace advice given to you by your health care provider. Make sure you discuss any questions you have with your health care provider.      Neta Mends. Wes Lezotte M.D.

## 2014-09-18 ENCOUNTER — Encounter: Payer: Self-pay | Admitting: Internal Medicine

## 2014-09-29 ENCOUNTER — Encounter: Payer: Self-pay | Admitting: Internal Medicine

## 2014-09-29 NOTE — Telephone Encounter (Signed)
I was unaware that 28 days not  Covered  Until  Just now  [facility] but she needs to remember that there is a 7 days break in between packs

## 2014-09-30 MED ORDER — NORETHINDRONE ACET-ETHINYL EST 1-20 MG-MCG PO TABS: 1.0000 | ORAL_TABLET | Freq: Every day | ORAL | Status: DC

## 2014-10-21 ENCOUNTER — Ambulatory Visit (INDEPENDENT_AMBULATORY_CARE_PROVIDER_SITE_OTHER): Admitting: Family Medicine

## 2014-10-21 ENCOUNTER — Encounter: Payer: Self-pay | Admitting: Family Medicine

## 2014-10-21 ENCOUNTER — Telehealth: Payer: Self-pay | Admitting: Internal Medicine

## 2014-10-21 VITALS — BP 100/65 | HR 87 | Temp 98.2°F | Ht 64.0 in | Wt 146.0 lb

## 2014-10-21 DIAGNOSIS — R0781 Pleurodynia: Secondary | ICD-10-CM

## 2014-10-21 NOTE — Telephone Encounter (Signed)
PLEASE NOTE: All timestamps contained within this report are represented as [place] Standard Time. CONFIDENTIALTY NOTICE: This fax transmission is intended only for the addressee. It contains information that is legally privileged, confidential or otherwise protected from use or disclosure. If you are not the intended recipient, you are strictly prohibited from reviewing, disclosing, copying using or disseminating any of this information or taking any action in reliance on or regarding this information. If you have received this fax in error, please notify us immediately by telephone so that we can arrange for its return to us. Phone: [phone], Toll-Free: [phone], Fax: [phone] Page: 1 of 1 Call Id: [id number] [facility] - Client TELEPHONE ADVICE RECORD [facility] Patient Name: Bethany Jefferson DOB: [birth year] Initial Comment Caller states she was having left breast pain, shooting pain when she breathed. Today sore to touch but no pain with breathing. Nurse Assessment Nurse: Kemper Durielarke, RN, Lurena Joinerebecca Date/Time Lamount Cohen(Eastern Time): 10/21/2014 1:35:31 PM Confirm and document reason for call. If symptomatic, describe symptoms. ---Caller states she was having left chest pain, shooting pain when she breathed. Today sore to touch but no pain with breathing. Has the patient traveled out of the country within the last 30 days? ---Not Applicable Does the patient require triage? ---Yes Related visit to physician within the last 2 weeks? ---No Does the PT have any chronic conditions? (i.e. diabetes, asthma, etc.) ---No Did the patient indicate they were pregnant? ---No Guidelines Guideline Title Affirmed Question Affirmed Notes Chest Pain Chest pain lasts > 5 minutes (Exceptions: chest pain occurring > 3 days ago and now asymptomatic; same as previously diagnosed heartburn and has accompanying sour taste in mouth) Final Disposition User Go to ED Now  (or PCP triage) Kemper Durielarke, RN, Lurena Joinerebecca

## 2014-10-21 NOTE — Progress Notes (Signed)
Pre visit review using our clinic review tool, if applicable. No additional management support is needed unless otherwise documented below in the visit note. 

## 2014-10-21 NOTE — Telephone Encounter (Signed)
FYI

## 2014-10-21 NOTE — Progress Notes (Signed)
   Subjective:    Patient ID: Bethany Jefferson, female    DOB: 03-Feb-1991, 24 y.o.   MRN: [id number]  HPI Here for 2 days of sharp pains in the left lower rib area and local soreness. No coughing or SOB. Yesterday it hurt to take a deep breath but not today. Yesterday the area was a bit swollen but no today. No recent trauma.    Review of Systems  Constitutional: Negative.   Respiratory: Negative.   Cardiovascular: Positive for chest pain. Negative for palpitations and leg swelling.       Objective:   Physical Exam  Constitutional: She appears well-developed and well-nourished. No distress.  Cardiovascular: Normal rate, regular rhythm, normal heart sounds and intact distal pulses.   Pulmonary/Chest: Effort normal and breath sounds normal. No respiratory distress. She has no wheezes. She has no rales.  She is mildly tender over the left anterior lower rib margin. No swelling or crepitus. No rash or bruising is seen.           Assessment & Plan:  She appears to have strained an intercostal muscle, possibly by coughing or sneezing. It seems to be healing on its own. She will observe only and recheck prn

## 2014-10-28 ENCOUNTER — Encounter: Payer: Self-pay | Admitting: Internal Medicine

## 2014-10-28 NOTE — Telephone Encounter (Signed)
Tell pt   dont think it is the medication unless she is having  A migraine headache withdrawal from  hormone that week. Also make sure preg tests in negative

## 2014-11-10 ENCOUNTER — Encounter: Payer: Self-pay | Admitting: Internal Medicine

## 2014-11-11 MED ORDER — NORETHINDRONE ACET-ETHINYL EST 1-20 MG-MCG PO TABS: 1.0000 | ORAL_TABLET | Freq: Every day | ORAL | Status: DC

## 2014-12-15 ENCOUNTER — Encounter: Payer: Self-pay | Admitting: Internal Medicine

## 2014-12-15 ENCOUNTER — Other Ambulatory Visit: Payer: Self-pay | Admitting: Internal Medicine

## 2014-12-15 MED ORDER — LEVOTHYROXINE SODIUM 175 MCG PO TABS: 175.0000 ug | ORAL_TABLET | Freq: Every day | ORAL | Status: DC

## 2015-01-02 ENCOUNTER — Other Ambulatory Visit (INDEPENDENT_AMBULATORY_CARE_PROVIDER_SITE_OTHER)

## 2015-01-02 ENCOUNTER — Encounter: Payer: Self-pay | Admitting: Internal Medicine

## 2015-01-02 DIAGNOSIS — E039 Hypothyroidism, unspecified: Secondary | ICD-10-CM

## 2015-01-02 LAB — TSH: TSH: 0.03 u[IU]/mL — AB (ref 0.35–4.50)

## 2015-01-09 ENCOUNTER — Ambulatory Visit: Admitting: Internal Medicine

## 2015-01-14 ENCOUNTER — Ambulatory Visit (INDEPENDENT_AMBULATORY_CARE_PROVIDER_SITE_OTHER): Admitting: Internal Medicine

## 2015-01-14 ENCOUNTER — Encounter: Payer: Self-pay | Admitting: Internal Medicine

## 2015-01-14 DIAGNOSIS — F4323 Adjustment disorder with mixed anxiety and depressed mood: Secondary | ICD-10-CM

## 2015-01-14 DIAGNOSIS — Z3041 Encounter for surveillance of contraceptive pills: Secondary | ICD-10-CM | POA: Diagnosis not present

## 2015-01-14 DIAGNOSIS — N943 Premenstrual tension syndrome: Secondary | ICD-10-CM

## 2015-01-14 DIAGNOSIS — G43829 Menstrual migraine, not intractable, without status migrainosus: Secondary | ICD-10-CM | POA: Diagnosis not present

## 2015-01-14 DIAGNOSIS — L0292 Furuncle, unspecified: Secondary | ICD-10-CM

## 2015-01-14 DIAGNOSIS — E890 Postprocedural hypothyroidism: Secondary | ICD-10-CM

## 2015-01-14 DIAGNOSIS — D649 Anemia, unspecified: Secondary | ICD-10-CM

## 2015-01-14 DIAGNOSIS — Z79899 Other long term (current) drug therapy: Secondary | ICD-10-CM | POA: Diagnosis not present

## 2015-01-14 MED ORDER — DOXYCYCLINE HYCLATE 100 MG PO TABS: 100.0000 mg | ORAL_TABLET | Freq: Two times a day (BID) | ORAL | Status: DC

## 2015-01-14 NOTE — Patient Instructions (Addendum)
HA sounds like migraine   OCP withdrawal headache .   Take 2 aleve generic  twice a day  For the headache .  considier taking continuously  ... OCPS .  Or  Start after 3-4 days instead of 7 days  But have to keep track for this.   For now stay on same dose of  Thyroid med     Check labs in 6-8 weeks  tsh free t4 cbcdiff   ibd  Pre visit and then ROV  .  Antibiotic and warm compresses for the  Boil you are treating .

## 2015-01-14 NOTE — Progress Notes (Signed)
Pre visit review using our clinic review tool, if applicable. No additional management support is needed unless otherwise documented below in the visit note.  Chief Complaint  Patient presents with  . Follow-up    thyroid meds labs     HPI: Bethany Jefferson 24 y.o.  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  Chaotic  and non predictable  Schedule  Living at home  Neg tad or caffiene.  Has boil coming back in gu area hot compresses some draining  improving no fever  ROS: See pertinent positives and negatives per HPI. No cp sob syncope doesn't think the thyroid med is causing any sx.   Past Medical History  Diagnosis Date  . Asthma     as a child quiescent  . History of varicella   . Scoliosis     fusion rod  2002  . Hyperthyroidism     rai ablation 8 12     Family History  Problem Relation Age of Onset  . GER disease Mother   . Hyperlipidemia Father   . Graves' disease Father   . Thyroid disease Father   . Crohn's disease Sister     History   Social History  . Marital Status: Single    Spouse Name: N/A  . Number of Children: N/A  . Years of Education: N/A   Social History Main Topics  . Smoking status: Never Smoker   . Smokeless tobacco: Never Used  . Alcohol Use: 0.0 oz/week    0 Standard drinks or equivalent per week     Comment: socially  . Drug Use: No  . Sexual Activity: Not on file   Other Topics Concern  . None   Social History Narrative   Single   HH of 4   Did one year at [employer]   [employer]  Transferred  to    [employer] .   Rising junior social work programs Did on [employer] summer [employer]    Mom a Engineer, civil (consulting)nurse at [employer]   No current  partners      sophomore or such.     Outpatient Prescriptions Prior to Visit  Medication Sig Dispense Refill  . levothyroxine (SYNTHROID, LEVOTHROID) 175 MCG tablet Take 1 tablet (175 mcg total) by mouth daily. 90 tablet 1  . norethindrone-ethinyl estradiol (MICROGESTIN) 1-20 MG-MCG tablet Take 1 tablet by mouth daily. 63 tablet 0  . sertraline (ZOLOFT) 50 MG tablet Take 1 tablet (50 mg total) by mouth daily. 90 tablet 3   No facility-administered medications prior to visit.     EXAM:  BP 110/64 mmHg  Temp(Src) 97.5 F (36.4 C) (Oral)  Ht 5\' 4"  (1.626 m)  Wt 141 lb 4.8 oz (64.093 kg)  BMI 24.24 kg/m2  Body mass index is 24.24 kg/(m^2).  GENERAL: vitals reviewed and listed above, alert, oriented, appears well hydrated and in no acute distress excess motor activity  But speech and attendtion is good and nl eye contact  No tremor HEENT: atraumatic, conjunctiva  clear, no obvious abnormalities on inspection of external nose and ears OP : no  lesion edema or exudate  NECK: no obvious masses on inspection palpation  LUNGS: clear to auscultation bilaterally, no wheezes, rales or rhonchi, good air movement CV: HRRR, no clubbing cyanosis or  peripheral edema nl cap refill  Abdomen:  Sof,t normal bowel sounds without hepatosplenomegaly, no guarding rebound or masses no CVA tenderness MS: moves all extremities without noticeable focal  abnormality PSYCH: pleasant and cooperative, no obvious depression   Some anxiety and hyper motor movement   SKIN:  Boil some draining yellow in right suprapubic area about 1.5 cm induration  No streaking  BP Readings from Last 3 Encounters:  01/14/15 110/64  10/21/14 100/65  09/11/14 104/76   Wt Readings from Last 3 Encounters:  01/14/15 141 lb 4.8 oz (64.093 kg)  10/21/14 146 lb (66.225 kg)  09/11/14 143 lb 1.6 oz (64.91 kg)   Lab Results  Component Value Date   WBC 4.5 01/31/2014   HGB 11.6* 01/31/2014   HCT 35.8* 01/31/2014   PLT 355.0  01/31/2014   GLUCOSE 69* 01/31/2014   CHOL 169 04/01/2013   TRIG 51.0 04/01/2013   HDL 54.10 04/01/2013   LDLCALC 105* 04/01/2013   ALT 10 01/31/2014   AST 15 01/31/2014   NA 137 01/31/2014   K 4.2 01/31/2014   CL 105 01/31/2014   CREATININE 0.8 01/31/2014   BUN 10 01/31/2014   CO2 28 01/31/2014   TSH 0.03* 01/02/2015    ASSESSMENT AND PLAN:  Discussed the following assessment and plan:  Hypothyroidism, unspecified hypothyroidism type - over suppressed today reveiwed  repeat in 6-8 weeks adjust dose if still abnormal .  Medication management  Adjustment disorder with mixed anxiety and depressed mood - more anxiety and stress for now job unpredicatbility is an issue at home now. advise try counseling local to help consider change med etc   Oral contraceptive pill surveillance - seems to help   Menstrual migraine without status migrainosus, not intractable - dsic doesnt wnat to try continueous therapy at this time   2 aleve bid and track   Adjustment disorder with depressed mood  Boils - recurred right sputrapubic area hot ocmpresses and antibiotic   Anemia, unspecified anemia type - presumed iron defic fu at next blood tests with [facility]  -Patient advised to return or notify health care team  if symptoms worsen ,persist or new concerns arise.  Patient Instructions  HA sounds like migraine   OCP withdrawal headache .   Take 2 aleve generic  twice a day  For the headache .  considier taking continuously  ... OCPS .  Or  Start after 3-4 days instead of 7 days  But have to keep track for this.   For now stay on same dose of  Thyroid med     Check labs in 6-8 weeks  tsh free t4 cbcdiff   ibd  Pre visit and then ROV  .  Antibiotic and warm compresses for the  Boil you are treating .    Neta MendsWanda K. Panosh M.D.

## 2015-02-10 ENCOUNTER — Telehealth: Payer: Self-pay | Admitting: Internal Medicine

## 2015-02-10 ENCOUNTER — Ambulatory Visit (INDEPENDENT_AMBULATORY_CARE_PROVIDER_SITE_OTHER)
Admission: RE | Admit: 2015-02-10 | Discharge: 2015-02-10 | Disposition: A | Discharge status: Home / Self Care | Source: Ambulatory Visit | Attending: Internal Medicine | Admitting: Internal Medicine

## 2015-02-10 ENCOUNTER — Ambulatory Visit (INDEPENDENT_AMBULATORY_CARE_PROVIDER_SITE_OTHER): Admitting: Internal Medicine

## 2015-02-10 ENCOUNTER — Encounter: Payer: Self-pay | Admitting: Internal Medicine

## 2015-02-10 VITALS — BP 110/70 | HR 66 | Temp 98.4°F | Wt 138.0 lb

## 2015-02-10 DIAGNOSIS — K5900 Constipation, unspecified: Secondary | ICD-10-CM

## 2015-02-10 DIAGNOSIS — R1030 Lower abdominal pain, unspecified: Secondary | ICD-10-CM | POA: Diagnosis not present

## 2015-02-10 LAB — CBC WITH DIFFERENTIAL/PLATELET
Basophils Absolute: 0.1 10*3/uL (ref 0.0–0.1)
Basophils Relative: 0.9 % (ref 0.0–3.0)
Eosinophils Absolute: 0.3 10*3/uL (ref 0.0–0.7)
Eosinophils Relative: 5.4 % — ABNORMAL HIGH (ref 0.0–5.0)
HCT: 35.7 % — ABNORMAL LOW (ref 36.0–46.0)
HEMOGLOBIN: 11.6 g/dL — AB (ref 12.0–15.0)
LYMPHS ABS: 2.1 10*3/uL (ref 0.7–4.0)
Lymphocytes Relative: 35.1 % (ref 12.0–46.0)
MCHC: 32.6 g/dL (ref 30.0–36.0)
MCV: 85.6 fl (ref 78.0–100.0)
MONO ABS: 0.6 10*3/uL (ref 0.1–1.0)
Monocytes Relative: 10.6 % (ref 3.0–12.0)
NEUTROS PCT: 48 % (ref 43.0–77.0)
Neutro Abs: 2.8 10*3/uL (ref 1.4–7.7)
PLATELETS: 289 10*3/uL (ref 150.0–400.0)
RBC: 4.17 Mil/uL (ref 3.87–5.11)
RDW: 15.4 % (ref 11.5–15.5)
WBC: 5.9 10*3/uL (ref 4.0–10.5)

## 2015-02-10 LAB — POCT URINALYSIS DIPSTICK
Bilirubin, UA: NEGATIVE
Blood, UA: NEGATIVE
GLUCOSE UA: NEGATIVE
KETONES UA: NEGATIVE
Leukocytes, UA: NEGATIVE
NITRITE UA: NEGATIVE
PROTEIN UA: NEGATIVE
Spec Grav, UA: 1.02
Urobilinogen, UA: 0.2
pH, UA: 7

## 2015-02-10 LAB — POCT URINE PREGNANCY: Preg Test, Ur: NEGATIVE

## 2015-02-10 NOTE — Telephone Encounter (Signed)
2:15 appointment and will call back if sx worsen before that

## 2015-02-10 NOTE — Patient Instructions (Signed)
Urine and preg test is normal . Get abd x ray as we discussed . If ok want you to take   miralax otc  Capful   Twice a day until better with the constipation Also   dulcolax suppository  X 1-2 days .  If fever pain worsening or not improving  then contact us  Blood count today  Check for infection unexpected

## 2015-02-10 NOTE — Progress Notes (Signed)
Pre visit review using our clinic review tool, if applicable. No additional management support is needed unless otherwise documented below in the visit note.   Chief Complaint  Patient presents with  . Abdominal Pain    lower with constipation    HPI: Patient Bethany Jefferson  comes in today for SDA for  new problem evaluation.sent i [facility] urgent visit per  Triage health team  Onset 5 days of constipation    And onset with abd pain lower bilateral. Took laxative  Dulcolax   Some help  But not as much .  Monday yesterday and then yesterday and  Not a lot. Of stool feels like she has to go no vomiting fever nausea but only a clear liquids today because of team health recommendation. Took ibuprofen today and it did help with her pain. See above and notes   No fever cp sob  uti sx  On ocps lmp May 16  lmpwas normal for her. Denies risk of STI pregnancy on OCPs. Not in a relationship. No iron . No new medicines.  ROS: See pertinent positives and negatives per HPI. Denies any GU symptoms UTI symptoms vaginal discharge recent sexual activity risk for STI.  Past Medical History  Diagnosis Date  . Asthma     as a child quiescent  . History of varicella   . Scoliosis     fusion rod  2002  . Hyperthyroidism     rai ablation 8 12     Family History  Problem Relation Age of Onset  . GER disease Mother   . Hyperlipidemia Father   . Graves' disease Father   . Thyroid disease Father   . Crohn's disease Sister     History   Social History  . Marital Status: Single    Spouse Name: N/A  . Number of Children: N/A  . Years of Education: N/A   Social History Main Topics  . Smoking status: Never Smoker   . Smokeless tobacco: Never Used  . Alcohol Use: 0.0 oz/week    0 Standard drinks or equivalent per week     Comment: socially  . Drug Use: No  . Sexual Activity: Not on file   Other Topics Concern  . None   Social History Narrative   Single   HH of 4   Did one year at [employer]  Transferred  to    [employer] .   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    Mom a nurse at [employer]    Outpatient Prescriptions Prior to Visit  Medication Sig Dispense Refill  . levothyroxine (SYNTHROID, LEVOTHROID) 175 MCG tablet Take 1 tablet (175 mcg total) by mouth daily. 90 tablet 1  . norethindrone-ethinyl estradiol (MICROGESTIN) 1-20 MG-MCG tablet Take 1 tablet by mouth daily. 63 tablet 0  . sertraline (ZOLOFT) 50 MG tablet Take 1 tablet (50 mg total) by mouth daily. 90 tablet 3  . doxycycline (VIBRA-TABS) 100 MG tablet Take 1 tablet (100 mg total) by mouth 2 (two) times daily. 14 tablet 0   No facility-administered medications prior to visit.     EXAM:  BP 110/70 mmHg  Pulse 66  Temp(Src) 98.4 F (36.9 C) (Oral)  Wt 138 lb (62.596 kg)  SpO2 98%  LMP 01/18/2015 (Approximate)  Body mass index  is 23.68 kg/(m^2).  GENERAL: vitals reviewed and listed above, alert, oriented, appears well hydrated and in no acute distress HEENT: atraumatic, conjunctiva  clear, no obvious abnormalities on inspection of external nose and ears NECK: no obvious masses on inspection palpation  LUNGS: clear to auscultation bilaterally, no wheezes, rales or rhonchi, good air movement CV: HRRR, no clubbing cyanosis or  peripheral edema nl cap refill  Abdomen soft without organomegaly guarding or rebound bowel sounds are present stool palpated in left: But not particularly tender she points to mid lower abdominal with lateral area of discomfort no flank pain appear to nail signs negative psoas signs MS: moves all extremities without noticeable focal  abnormality PSYCH: pleasant and cooperative, no obvious depression or anxiety Lab Results  Component Value Date   WBC 5.9 02/10/2015   HGB 11.6* 02/10/2015   HCT 35.7* 02/10/2015   PLT 289.0  02/10/2015   GLUCOSE 69* 01/31/2014   CHOL 169 04/01/2013   TRIG 51.0 04/01/2013   HDL 54.10 04/01/2013   LDLCALC 105* 04/01/2013   ALT 10 01/31/2014   AST 15 01/31/2014   NA 137 01/31/2014   K 4.2 01/31/2014   CL 105 01/31/2014   CREATININE 0.8 01/31/2014   BUN 10 01/31/2014   CO2 28 01/31/2014   TSH 0.03* 01/02/2015   UCG and urinalysis is negative ASSESSMENT AND PLAN:  Discussed the following assessment and plan:  Lower abdominal pain - Plan: POCT urine pregnancy, POC Urinalysis Dipstick, CBC with Differential/Platelet, DG Abd 2 Views, CANCELED: CBC with Differential/Platelet  Constipation, unspecified constipation type - Plan: CBC with Differential/Platelet, DG Abd 2 Views, CANCELED: CBC with Differential/Platelet Poss iron defic see last note is not taking iron or other medication that could cause this problem doesn't really seem pelvic but very unusual for her to have these symptoms. Relieved somewhat with ibuprofen incomplete evacuation after an over-the-counter Dulcolax. Get abdominal plain film CBC and if no acute findings plan cleanout. Follow-up if not improving or progressive alarm symptoms etc. May need to recheck before the weekend if not improving. Her exam is reassuring considering the severity report of her pain although she was on ibuprofen 1 we saw her. -Patient advised to return or notify health care team  if symptoms worsen ,persist or new concerns arise.  Patient Instructions  Urine and preg test is normal . Get abd x ray as we discussed . If ok want you to take   miralax otc  Capful   Twice a day until better with the constipation Also   dulcolax suppository  X 1-2 days .  If fever pain worsening or not improving  then contact us  Blood count today  Check for infection unexpected    Neta Mends. Panosh M.D.

## 2015-02-10 NOTE — Telephone Encounter (Signed)
[facility] - Client TELEPHONE ADVICE RECORD [facility]  Patient Name: Bethany Jefferson  Gender: Female  DOB: 02/21/1991   Age: [age] Y 4 M 26 D  Return Phone Number: [phone]) [phone] (Primary), [phone]) [phone] (Secondary)  Address: [address]   City[place] [id number]   Client [facility] - Client  Client Site [facility] Day  Contact Type Call  Call Type Triage / Clinical  Relationship To Patient Self  Return Phone Number [phone] (Secondary)  Chief Complaint Abdominal Pain  Initial Comment Caller states she is constipated, she took a laxative yesterday. Still having abdominal pain.  PreDisposition Call Doctor   Nurse Assessment  Nurse: Arnold LongMaples, RN, Trish Date/Time Lamount Cohen(Eastern Time): 02/10/2015 8:29:24 AM  Confirm and document reason for call. If symptomatic, describe symptoms. ---Patient is calling for self and caller states she is constipated, she took a laxative yesterday. Still having abdominal pain. Abdominal area at bottom all across is hurting. Constant cramping. Last Tuesday had a normal stools and Thursday began to feel abdominal pain. No vomiting. eating more fiber.  Has the patient traveled out of the country within the last 30 days? ---No  Does the patient require triage? ---Yes  Related visit to physician within the last 2 weeks? ---No  Does the PT have any chronic conditions? (i.e. diabetes, asthma, etc.) ---Yes  List chronic conditions. ---hypothyroid and is on synthroid 150 mcg had level done and is low thyroid and has follow up  Did the patient indicate they were pregnant? ---No     Guidelines      Guideline Title Affirmed Question Affirmed Notes Nurse Date/Time (Eastern Time)  Constipation [1] Constant abdominal pain AND [2] present > 2 hours  Arnold LongMaples, Charity fundraiserN, Trish 02/10/2015 8:35:19 AM   Disp. Time Lamount Cohen(Eastern Time) Disposition Final User          02/10/2015 8:38:29 AM  See Physician within 4 Hours (or PCP triage) Yes Arnold LongMaples, RN, Les Pourish        Caller Understands: Yes  Disagree/Comply: Comply     Care Advice Given Per Guideline      SEE PHYSICIAN WITHIN 4 HOURS (or PCP triage): NOTHING BY MOUTH: Do not eat or drink anything for now. CALL BACK IF: * You become worse. CARE ADVICE given per Constipation (Adult) guideline.   After Care Instructions Given     Call Event Type User Date / Time Description

## 2015-02-11 ENCOUNTER — Other Ambulatory Visit: Payer: Self-pay | Admitting: Internal Medicine

## 2015-02-12 MED ORDER — NORETHINDRONE ACET-ETHINYL EST 1-20 MG-MCG PO TABS: 1.0000 | ORAL_TABLET | Freq: Every day | ORAL | Status: DC

## 2015-02-12 NOTE — Addendum Note (Signed)
Addended by: Raj Janus T on: 02/12/2015 11:37 AM   Modules accepted: Orders

## 2015-02-20 ENCOUNTER — Other Ambulatory Visit (INDEPENDENT_AMBULATORY_CARE_PROVIDER_SITE_OTHER)

## 2015-02-20 DIAGNOSIS — D509 Iron deficiency anemia, unspecified: Secondary | ICD-10-CM

## 2015-02-20 DIAGNOSIS — E039 Hypothyroidism, unspecified: Secondary | ICD-10-CM | POA: Diagnosis not present

## 2015-02-20 LAB — CBC WITH DIFFERENTIAL/PLATELET
BASOS ABS: 0 10*3/uL (ref 0.0–0.1)
Basophils Relative: 0.8 % (ref 0.0–3.0)
EOS ABS: 0.4 10*3/uL (ref 0.0–0.7)
Eosinophils Relative: 9.1 % — ABNORMAL HIGH (ref 0.0–5.0)
HCT: 37.6 % (ref 36.0–46.0)
Hemoglobin: 12 g/dL (ref 12.0–15.0)
LYMPHS PCT: 36.3 % (ref 12.0–46.0)
Lymphs Abs: 1.6 10*3/uL (ref 0.7–4.0)
MCHC: 31.9 g/dL (ref 30.0–36.0)
MCV: 88.9 fl (ref 78.0–100.0)
Monocytes Absolute: 0.5 10*3/uL (ref 0.1–1.0)
Monocytes Relative: 10.5 % (ref 3.0–12.0)
NEUTROS PCT: 43.3 % (ref 43.0–77.0)
Neutro Abs: 1.9 10*3/uL (ref 1.4–7.7)
Platelets: 314 10*3/uL (ref 150.0–400.0)
RBC: 4.23 Mil/uL (ref 3.87–5.11)
RDW: 15.5 % (ref 11.5–15.5)
WBC: 4.4 10*3/uL (ref 4.0–10.5)

## 2015-02-20 LAB — IBC PANEL
Iron: 31 ug/dL — ABNORMAL LOW (ref 42–145)
Saturation Ratios: 7.4 % — ABNORMAL LOW (ref 20.0–50.0)
Transferrin: 298 mg/dL (ref 212.0–360.0)

## 2015-02-20 LAB — FERRITIN: Ferritin: 5.1 ng/mL — ABNORMAL LOW (ref 10.0–291.0)

## 2015-02-20 LAB — TSH: TSH: 0.03 u[IU]/mL — ABNORMAL LOW (ref 0.35–4.50)

## 2015-02-23 ENCOUNTER — Other Ambulatory Visit

## 2015-02-23 ENCOUNTER — Encounter: Payer: Self-pay | Admitting: Internal Medicine

## 2015-03-02 ENCOUNTER — Ambulatory Visit: Admitting: Internal Medicine

## 2015-03-03 ENCOUNTER — Encounter: Payer: Self-pay | Admitting: Internal Medicine

## 2015-03-05 NOTE — Telephone Encounter (Signed)
I agree with getting back on the right . Dose  document and  Cancel /delay lab and fu  appt  to 2 months after getting right dose .

## 2015-03-06 ENCOUNTER — Ambulatory Visit: Admitting: Internal Medicine

## 2015-03-06 ENCOUNTER — Ambulatory Visit (INDEPENDENT_AMBULATORY_CARE_PROVIDER_SITE_OTHER): Admitting: Psychology

## 2015-03-06 DIAGNOSIS — F411 Generalized anxiety disorder: Secondary | ICD-10-CM

## 2015-03-13 ENCOUNTER — Ambulatory Visit (INDEPENDENT_AMBULATORY_CARE_PROVIDER_SITE_OTHER): Admitting: Psychology

## 2015-03-13 DIAGNOSIS — F411 Generalized anxiety disorder: Secondary | ICD-10-CM

## 2015-03-30 ENCOUNTER — Encounter: Payer: Self-pay | Admitting: Internal Medicine

## 2015-03-31 MED ORDER — LEVOTHYROXINE SODIUM 150 MCG PO TABS: 150.0000 ug | ORAL_TABLET | Freq: Every day | ORAL | Status: DC

## 2015-03-31 NOTE — Telephone Encounter (Signed)
Misty tell her to decrease  To synthroid 150 mcg per day disp 90 then need recheck tsh  Free t4 in 2 -3 months

## 2015-04-03 ENCOUNTER — Ambulatory Visit (INDEPENDENT_AMBULATORY_CARE_PROVIDER_SITE_OTHER): Admitting: Psychology

## 2015-04-03 DIAGNOSIS — F411 Generalized anxiety disorder: Secondary | ICD-10-CM

## 2015-04-24 ENCOUNTER — Ambulatory Visit (INDEPENDENT_AMBULATORY_CARE_PROVIDER_SITE_OTHER): Admitting: Psychology

## 2015-04-24 DIAGNOSIS — F411 Generalized anxiety disorder: Secondary | ICD-10-CM | POA: Diagnosis not present

## 2015-04-30 ENCOUNTER — Ambulatory Visit (INDEPENDENT_AMBULATORY_CARE_PROVIDER_SITE_OTHER): Payer: Worker's Compensation | Admitting: Family Medicine

## 2015-04-30 ENCOUNTER — Ambulatory Visit: Payer: Worker's Compensation

## 2015-04-30 VITALS — BP 92/70 | HR 71 | Temp 98.4°F | Resp 15 | Ht 63.5 in | Wt 147.0 lb

## 2015-04-30 DIAGNOSIS — S39012A Strain of muscle, fascia and tendon of lower back, initial encounter: Secondary | ICD-10-CM | POA: Diagnosis not present

## 2015-04-30 DIAGNOSIS — S29009A Unspecified injury of muscle and tendon of unspecified wall of thorax, initial encounter: Secondary | ICD-10-CM | POA: Diagnosis not present

## 2015-04-30 DIAGNOSIS — S29019A Strain of muscle and tendon of unspecified wall of thorax, initial encounter: Secondary | ICD-10-CM

## 2015-04-30 MED ORDER — TRAMADOL HCL 50 MG PO TABS
50.0000 mg | ORAL_TABLET | Freq: Three times a day (TID) | ORAL | Status: DC | PRN
Start: 2015-04-30 — End: 2015-08-18

## 2015-04-30 NOTE — Progress Notes (Signed)
Bethany Jefferson 05-13-1991 24 y.o.   Chief Complaint  Patient presents with  . Back Injury    2nd visit for this/ Thinks she has reinjured it/ Now it is the upper back. Seen on 20th 1st time     Date of Injury: 04/23/15 and today.    History of Present Illness:  Presents for evaluation of work-related complaint. She hurt her back lifting things to put in a dishwasher at work.  At first it felt "like a twitch in my back."  Then later at home she sneezed and had worsening of the pain.   She was seen at another UC clinic and was treated with NSAID and a muscle relaxer. She was seen there on 8/20.  The muscle relaxer does help, but it makes her sleepy so she only takes it at night.  However this original injury was not reported as WC within 24 hours.   She continued to work her normal job.  Her job does involve a lot of twisting and turning. Today she was doing her job again and had more of a pain in her upper back which she did report to her job.  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    BP Readings from Last 3 Encounters:  04/30/15 92/70  02/10/15 110/70  01/14/15 110/64     ROS  As per HPI- otherwise negative.  No Known Allergies   Current medications reviewed and updated. Past medical history, family history, social history have been reviewed and updated.   Physical Exam  GEN: WDWN, NAD, Non-toxic, A & O x 3, looks well HEENT: Atraumatic, Normocephalic. Neck supple. No masses, No LAD. Ears and Nose: No external deformity. CV: RRR, No M/G/R. No JVD. No thrill. No extra heart sounds. PULM: CTA B, no wheezes, crackles, rhonchi. No retractions. No resp. distress. No accessory muscle use. EXTR: No c/c/e NEURO Normal gait.  PSYCH: Normally interactive. Conversant. Not depressed or anxious appearing.  Calm  demeanor.  She has a midline scar down her thoracic spine due to history of fusion in her teens She has tenderness in the parathoracic muscles, more on the left that the right Normal strength and DTR of all extremities  Thoracolumbar flexion is somewhat limited but she has had a fusion No bony TTP noted  [facility] reading (PRIMARY) by  Dr. Rowe Pavy. Thoracic spine:s/p surgical fusion.  No acute change noted, hardware appears to be in place  Lumbar spine: increased stool in colon.  Spine negative  THORACIC SPINE 2 VIEWS  COMPARISON: None.  FINDINGS: Posterior rod and pedicular screw fixation from T5-T12 identified. There is no evidence of acute fracture or subluxation. The disc spaces are maintained. No hardware complicating features are noted. IMPRESSION: No acute abnormalities. Thoracic surgical hardware without definite complicating features.   LUMBAR SPINE - COMPLETE 4+ VIEW  COMPARISON: None.  FINDINGS: Thoracic spinal hardware, incompletely visualized. Normal anatomic alignment of the lumbar spine. Preservation of the vertebral body and intervertebral disc space heights. No evidence for acute displaced lumbar spine fracture. SI joints are unremarkable. Stool throughout the colon.  IMPRESSION: No acute osseous abnormality. Lower thoracic spinal fusion hardware.  Stool throughout the colon as can be seen with constipation.  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    rx for tramadol to use for pain- cautioned that the may also be sedating but likely less so than the flexeril

## 2015-04-30 NOTE — Patient Instructions (Signed)
You can go back to work, but you will be on seated work only-no lifting- until recheck Come and see [place] on Monday for re-evaluation Use the tramadol as needed for pain- this can make you sleepy however, so use caution.  Do not drive until you make sure it does not make yousleepy Avoid using tramadol and flexeril at the same time unless absolutely necessary

## 2015-05-04 ENCOUNTER — Ambulatory Visit (INDEPENDENT_AMBULATORY_CARE_PROVIDER_SITE_OTHER): Payer: Worker's Compensation | Admitting: Internal Medicine

## 2015-05-04 VITALS — BP 96/62 | HR 82 | Temp 98.5°F | Resp 16 | Ht 63.5 in | Wt 144.0 lb

## 2015-05-04 DIAGNOSIS — M545 Low back pain, unspecified: Secondary | ICD-10-CM

## 2015-05-04 DIAGNOSIS — S39012D Strain of muscle, fascia and tendon of lower back, subsequent encounter: Secondary | ICD-10-CM | POA: Diagnosis not present

## 2015-05-04 DIAGNOSIS — Z8739 Personal history of other diseases of the musculoskeletal system and connective tissue: Secondary | ICD-10-CM

## 2015-05-04 NOTE — Patient Instructions (Signed)

## 2015-05-04 NOTE — Progress Notes (Signed)
Patient ID: Bethany Jefferson, female   DOB: [birth year], 24 y.o.   MRN: [id number]   05/04/2015 at 12:53 PM  Bethany Jefferson / DOB: 12-29-1990 / MRN: [id number]  Problem list reviewed and updated by me where necessary.   SUBJECTIVE  Bethany Jefferson is a 24 y.o. well appearing female presenting for the chief complaint of low back pain with no radiation, weakness, numbness or incontinence..  She is improving, pain localized across lower back assoc with bending. She wants to work. She had scoliosis surgery when young teen with good result. XR reports reviewed again   She  has a past medical history of Asthma; History of varicella; Scoliosis; Hyperthyroidism; Anxiety; and Depression.    Medications reviewed and updated by myself where necessary, and exist elsewhere in the encounter.   Bethany Jefferson has No Known Allergies. She  reports that she has never smoked. She has never used smokeless tobacco. She reports that she drinks alcohol. She reports that she does not use illicit drugs. She  has no sexual activity history on file. The patient  has past surgical history that includes Spinal fusion (2002) and Incision and drainage abscess (Right, 10/30/2013).  Her family history includes Crohn's disease in her sister; GER disease in her mother; Luiz Blare' disease in her father; Hyperlipidemia in her father; Hypertension in her father and mother; Thyroid disease in her father.  Review of Systems  Constitutional: Negative for fever.  Respiratory: Negative for shortness of breath.   Cardiovascular: Negative for chest pain.  Gastrointestinal: Negative for nausea.  Musculoskeletal: Positive for back pain.  Skin: Negative for rash.  Neurological: Negative.  Negative for dizziness and headaches.    OBJECTIVE  Her  height is 5' 3.5" (1.613 m) and weight is 144 lb (65.318 kg). Her oral temperature is 98.5 F (36.9 C). Her blood pressure is 96/62 and her pulse is 82. Her respiration is 16 and oxygen saturation is  98%.  The patient's body mass index is 25.11 kg/(m^2).  Physical Exam  Constitutional: She is oriented to person, place, and time. She appears well-developed and well-nourished.  HENT:  Head: Normocephalic.  Eyes: EOM are normal.  Neck: Normal range of motion.  Respiratory: Effort normal.  Musculoskeletal:       Lumbar back: She exhibits decreased range of motion, tenderness, pain and spasm. She exhibits no bony tenderness, no swelling, no edema, no deformity and normal pulse.  Neurological: She is alert and oriented to person, place, and time. She has normal strength and normal reflexes. No sensory deficit. She exhibits normal muscle tone. She displays a negative Romberg sign. Coordination and gait normal.    No results found for this or any previous visit (from the past 24 hour(s)).  ASSESSMENT & PLAN  Bethany Jefferson was seen today for follow-up.  Diagnoses and all orders for this visit:  Low back strain, subsequent encounter -     Ambulatory referral to Physical Therapy  Midline low back pain without sciatica -     Ambulatory referral to Physical Therapy  Hx of scoliosis -     Ambulatory referral to Physical Therapy

## 2015-05-08 ENCOUNTER — Ambulatory Visit: Payer: Self-pay | Admitting: Psychology

## 2015-05-13 ENCOUNTER — Encounter: Payer: Self-pay | Admitting: Internal Medicine

## 2015-05-14 NOTE — Telephone Encounter (Signed)
If this was an injury at work referral should be under workers comp? If so everything has to be approved by them   PT  Is  Probably a good idea  But the provider who evaluated  You and put you on restriction should do the referral .  If we can help we at least need the records.    OV if needed.

## 2015-05-18 ENCOUNTER — Ambulatory Visit (INDEPENDENT_AMBULATORY_CARE_PROVIDER_SITE_OTHER): Payer: Worker's Compensation | Admitting: Internal Medicine

## 2015-05-18 ENCOUNTER — Encounter: Payer: Self-pay | Admitting: Internal Medicine

## 2015-05-18 VITALS — BP 98/62 | HR 65 | Temp 98.4°F | Resp 16 | Ht 63.5 in | Wt 144.0 lb

## 2015-05-18 DIAGNOSIS — S39012D Strain of muscle, fascia and tendon of lower back, subsequent encounter: Secondary | ICD-10-CM | POA: Diagnosis not present

## 2015-05-18 NOTE — Progress Notes (Signed)
Patient ID: Bethany Jefferson, female   DOB: July 30, 1991, 24 y.o.   MRN: [id number]   05/18/2015 at 12:25 PM  Bethany Jefferson / DOB: 03/28/1991 / MRN: [id number]  Problem list reviewed and updated by me where necessary.   SUBJECTIVE  Bethany Jefferson is a 24 y.o. well appearing female presenting for the chief complaint of LB strain that has resolved.She is ready to return to work full duty.    She  has a past medical history of Asthma; History of varicella; Scoliosis; Hyperthyroidism; Anxiety; and Depression.    Medications reviewed and updated by myself where necessary, and exist elsewhere in the encounter.   Bethany Jefferson has No Known Allergies. She  reports that she has never smoked. She has never used smokeless tobacco. She reports that she drinks alcohol. She reports that she does not use illicit drugs. She  has no sexual activity history on file. The patient  has past surgical history that includes Spinal fusion (2002) and Incision and drainage abscess (Right, 10/30/2013).  Her family history includes Crohn's disease in her sister; GER disease in her mother; Luiz Blare' disease in her father; Hyperlipidemia in her father; Hypertension in her father and mother; Thyroid disease in her father.  Review of Systems  Constitutional: Negative for fever.  Respiratory: Negative for shortness of breath.   Cardiovascular: Negative for chest pain.  Gastrointestinal: Negative for nausea.  Skin: Negative for rash.  Neurological: Negative for dizziness and headaches.    OBJECTIVE  Her  height is 5' 3.5" (1.613 m) and weight is 144 lb (65.318 kg). Her oral temperature is 98.4 F (36.9 C). Her blood pressure is 98/62 and her pulse is 65. Her respiration is 16 and oxygen saturation is 97%.  The patient's body mass index is 25.11 kg/(m^2).  Physical Exam  Constitutional: She is oriented to person, place, and time. She appears well-developed and well-nourished.  HENT:  Head: Normocephalic.  Eyes: Conjunctivae  and EOM are normal. No scleral icterus.  Neck: Normal range of motion.  Cardiovascular: Normal rate.   Respiratory: Effort normal.  GI: She exhibits no distension.  Musculoskeletal: Normal range of motion. She exhibits no edema or tenderness.       Lumbar back: She exhibits normal range of motion, no tenderness, no bony tenderness, no swelling, no edema, no deformity, no pain and no spasm.  Neurological: She is alert and oriented to person, place, and time. She has normal strength and normal reflexes. No cranial nerve deficit or sensory deficit. Coordination and gait normal.  Skin: Skin is warm and dry.  Psychiatric: She has a normal mood and affect.    No results found for this or any previous visit (from the past 24 hour(s)).  ASSESSMENT & PLAN  [name] was seen today for follow-up.  Diagnoses and all orders for this visit:  Low back strain, subsequent encounter Full recovery and return to full duty

## 2015-05-18 NOTE — Patient Instructions (Signed)
Low Back Sprain with Rehab  A sprain is an injury in which a ligament is torn. The ligaments of the lower back are vulnerable to sprains. However, they are strong and require great force to be injured. These ligaments are important for stabilizing the spinal column. Sprains are classified into three categories. Grade 1 sprains cause pain, but the tendon is not lengthened. Grade 2 sprains include a lengthened ligament, due to the ligament being stretched or partially ruptured. With grade 2 sprains there is still function, although the function may be decreased. Grade 3 sprains involve a complete tear of the tendon or muscle, and function is usually impaired. SYMPTOMS   Severe pain in the lower back.  Sometimes, a feeling of a "pop," "snap," or tear, at the time of injury.  Tenderness and sometimes swelling at the injury site.  Uncommonly, bruising (contusion) within 48 hours of injury.  Muscle spasms in the back. CAUSES  Low back sprains occur when a force is placed on the ligaments that is greater than they can handle. Common causes of injury include:  Performing a stressful act while off-balance.  Repetitive stressful activities that involve movement of the lower back.  Direct hit (trauma) to the lower back. RISK INCREASES WITH:  Contact sports (football, wrestling).  Collisions (major skiing accidents).  Sports that require throwing or lifting (baseball, weightlifting).  Sports involving twisting of the spine (gymnastics, diving, tennis, golf).  Poor strength and flexibility.  Inadequate protection.  Previous back injury or surgery (especially fusion). PREVENTION  Wear properly fitted and padded protective equipment.  Warm up and stretch properly before activity.  Allow for adequate recovery between workouts.  Maintain physical fitness:  Strength, flexibility, and endurance.  Cardiovascular fitness.  Maintain a healthy body weight. PROGNOSIS  If treated  properly, low back sprains usually heal with non-surgical treatment. The length of time for healing depends on the severity of the injury.  RELATED COMPLICATIONS   Recurring symptoms, resulting in a chronic problem.  Chronic inflammation and pain in the low back.  Delayed healing or resolution of symptoms, especially if activity is resumed too soon.  Prolonged impairment.  Unstable or arthritic joints of the low back. TREATMENT  Treatment first involves the use of ice and medicine, to reduce pain and inflammation. The use of strengthening and stretching exercises may help reduce pain with activity. These exercises may be performed at home or with a therapist. Severe injuries may require referral to a therapist for further evaluation and treatment, such as ultrasound. Your caregiver may advise that you wear a back brace or corset, to help reduce pain and discomfort. Often, prolonged bed rest results in greater harm then benefit. Corticosteroid injections may be recommended. However, these should be reserved for the most serious cases. It is important to avoid using your back when lifting objects. At night, sleep on your back on a firm mattress, with a pillow placed under your knees. If non-surgical treatment is unsuccessful, surgery may be needed.  MEDICATION   If pain medicine is needed, nonsteroidal anti-inflammatory medicines (aspirin and ibuprofen), or other minor pain relievers (acetaminophen), are often advised.  Do not take pain medicine for 7 days before surgery.  Prescription pain relievers may be given, if your caregiver thinks they are needed. Use only as directed and only as much as you need.  Ointments applied to the skin may be helpful.  Corticosteroid injections may be given by your caregiver. These injections should be reserved for the most serious cases,   because they may only be given a certain number of times. HEAT AND COLD  Cold treatment (icing) should be applied for 10  to 15 minutes every 2 to 3 hours for inflammation and pain, and immediately after activity that aggravates your symptoms. Use ice packs or an ice massage.  Heat treatment may be used before performing stretching and strengthening activities prescribed by your caregiver, physical therapist, or athletic trainer. Use a heat pack or a warm water soak. SEEK MEDICAL CARE IF:   Symptoms get worse or do not improve in 2 to 4 weeks, despite treatment.  You develop numbness or weakness in either leg.  You lose bowel or bladder function.  Any of the following occur after surgery: fever, increased pain, swelling, redness, drainage of fluids, or bleeding in the affected area.  New, unexplained symptoms develop. (Drugs used in treatment may produce side effects.) EXERCISES  RANGE OF MOTION (ROM) AND STRETCHING EXERCISES - Low Back Sprain Most people with lower back pain will find that their symptoms get worse with excessive bending forward (flexion) or arching at the lower back (extension). The exercises that will help resolve your symptoms will focus on the opposite motion.  Your physician, physical therapist or athletic trainer will help you determine which exercises will be most helpful to resolve your lower back pain. Do not complete any exercises without first consulting with your caregiver. Discontinue any exercises which make your symptoms worse, until you speak to your caregiver. If you have pain, numbness or tingling which travels down into your buttocks, leg or foot, the goal of the therapy is for these symptoms to move closer to your back and eventually resolve. Sometimes, these leg symptoms will get better, but your lower back pain may worsen. This is often an indication of progress in your rehabilitation. Be very alert to any changes in your symptoms and the activities in which you participated in the 24 hours prior to the change. Sharing this information with your caregiver will allow him or her to  most efficiently treat your condition. These exercises may help you when beginning to rehabilitate your injury. Your symptoms may resolve with or without further involvement from your physician, physical therapist or athletic trainer. While completing these exercises, remember:   Restoring tissue flexibility helps normal motion to return to the joints. This allows healthier, less painful movement and activity.  An effective stretch should be held for at least 30 seconds.  A stretch should never be painful. You should only feel a gentle lengthening or release in the stretched tissue. FLEXION RANGE OF MOTION AND STRETCHING EXERCISES: STRETCH - Flexion, Single Knee to Chest   Lie on a firm bed or floor with both legs extended in front of you.  Keeping one leg in contact with the floor, bring your opposite knee to your chest. Hold your leg in place by either grabbing behind your thigh or at your knee.  Pull until you feel a gentle stretch in your low back. Hold __________ seconds.  Slowly release your grasp and repeat the exercise with the opposite side. Repeat __________ times. Complete this exercise __________ times per day.  STRETCH - Flexion, Double Knee to Chest  Lie on a firm bed or floor with both legs extended in front of you.  Keeping one leg in contact with the floor, bring your opposite knee to your chest.  Tense your stomach muscles to support your back and then lift your other knee to your chest. Hold your legs   in place by either grabbing behind your thighs or at your knees.  Pull both knees toward your chest until you feel a gentle stretch in your low back. Hold __________ seconds.  Tense your stomach muscles and slowly return one leg at a time to the floor. Repeat __________ times. Complete this exercise __________ times per day.  STRETCH - Low Trunk Rotation  Lie on a firm bed or floor. Keeping your legs in front of you, bend your knees so they are both pointed toward the  ceiling and your feet are flat on the floor.  Extend your arms out to the side. This will stabilize your upper body by keeping your shoulders in contact with the floor.  Gently and slowly drop both knees together to one side until you feel a gentle stretch in your low back. Hold for __________ seconds.  Tense your stomach muscles to support your lower back as you bring your knees back to the starting position. Repeat the exercise to the other side. Repeat __________ times. Complete this exercise __________ times per day  EXTENSION RANGE OF MOTION AND FLEXIBILITY EXERCISES: STRETCH - Extension, Prone on Elbows   Lie on your stomach on the floor, a bed will be too soft. Place your palms about shoulder width apart and at the height of your head.  Place your elbows under your shoulders. If this is too painful, stack pillows under your chest.  Allow your body to relax so that your hips drop lower and make contact more completely with the floor.  Hold this position for __________ seconds.  Slowly return to lying flat on the floor. Repeat __________ times. Complete this exercise __________ times per day.  RANGE OF MOTION - Extension, Prone Press Ups  Lie on your stomach on the floor, a bed will be too soft. Place your palms about shoulder width apart and at the height of your head.  Keeping your back as relaxed as possible, slowly straighten your elbows while keeping your hips on the floor. You may adjust the placement of your hands to maximize your comfort. As you gain motion, your hands will come more underneath your shoulders.  Hold this position __________ seconds.  Slowly return to lying flat on the floor. Repeat __________ times. Complete this exercise __________ times per day.  RANGE OF MOTION- Quadruped, Neutral Spine   Assume a hands and knees position on a firm surface. Keep your hands under your shoulders and your knees under your hips. You may place padding under your knees for  comfort.  Drop your head and point your tailbone toward the ground below you. This will round out your lower back like an angry cat. Hold this position for __________ seconds.  Slowly lift your head and release your tail bone so that your back sags into a large arch, like an old horse.  Hold this position for __________ seconds.  Repeat this until you feel limber in your low back.  Now, find your "sweet spot." This will be the most comfortable position somewhere between the two previous positions. This is your neutral spine. Once you have found this position, tense your stomach muscles to support your low back.  Hold this position for __________ seconds. Repeat __________ times. Complete this exercise __________ times per day.  STRENGTHENING EXERCISES - Low Back Sprain These exercises may help you when beginning to rehabilitate your injury. These exercises should be done near your "sweet spot." This is the neutral, low-back arch, somewhere between fully rounded   and fully arched, that is your least painful position. When performed in this safe range of motion, these exercises can be used for people who have either a flexion or extension based injury. These exercises may resolve your symptoms with or without further involvement from your physician, physical therapist or athletic trainer. While completing these exercises, remember:   Muscles can gain both the endurance and the strength needed for everyday activities through controlled exercises.  Complete these exercises as instructed by your physician, physical therapist or athletic trainer. Increase the resistance and repetitions only as guided.  You may experience muscle soreness or fatigue, but the pain or discomfort you are trying to eliminate should never worsen during these exercises. If this pain does worsen, stop and make certain you are following the directions exactly. If the pain is still present after adjustments, discontinue the  exercise until you can discuss the trouble with your caregiver. STRENGTHENING - Deep Abdominals, Pelvic Tilt   Lie on a firm bed or floor. Keeping your legs in front of you, bend your knees so they are both pointed toward the ceiling and your feet are flat on the floor.  Tense your lower abdominal muscles to press your low back into the floor. This motion will rotate your pelvis so that your tail bone is scooping upwards rather than pointing at your feet or into the floor. With a gentle tension and even breathing, hold this position for __________ seconds. Repeat __________ times. Complete this exercise __________ times per day.  STRENGTHENING - Abdominals, Crunches   Lie on a firm bed or floor. Keeping your legs in front of you, bend your knees so they are both pointed toward the ceiling and your feet are flat on the floor. Cross your arms over your chest.  Slightly tip your chin down without bending your neck.  Tense your abdominals and slowly lift your trunk high enough to just clear your shoulder blades. Lifting higher can put excessive stress on the lower back and does not further strengthen your abdominal muscles.  Control your return to the starting position. Repeat __________ times. Complete this exercise __________ times per day.  STRENGTHENING - Quadruped, Opposite UE/LE Lift   Assume a hands and knees position on a firm surface. Keep your hands under your shoulders and your knees under your hips. You may place padding under your knees for comfort.  Find your neutral spine and gently tense your abdominal muscles so that you can maintain this position. Your shoulders and hips should form a rectangle that is parallel with the floor and is not twisted.  Keeping your trunk steady, lift your right hand no higher than your shoulder and then your left leg no higher than your hip. Make sure you are not holding your breath. Hold this position for __________ seconds.  Continuing to keep  your abdominal muscles tense and your back steady, slowly return to your starting position. Repeat with the opposite arm and leg. Repeat __________ times. Complete this exercise __________ times per day.  STRENGTHENING - Abdominals and Quadriceps, Straight Leg Raise   Lie on a firm bed or floor with both legs extended in front of you.  Keeping one leg in contact with the floor, bend the other knee so that your foot can rest flat on the floor.  Find your neutral spine, and tense your abdominal muscles to maintain your spinal position throughout the exercise.  Slowly lift your straight leg off the floor about 6 inches for a count   of 15, making sure to not hold your breath.  Still keeping your neutral spine, slowly lower your leg all the way to the floor. Repeat this exercise with each leg __________ times. Complete this exercise __________ times per day. POSTURE AND BODY MECHANICS CONSIDERATIONS - Low Back Sprain Keeping correct posture when sitting, standing or completing your activities will reduce the stress put on different body tissues, allowing injured tissues a chance to heal and limiting painful experiences. The following are general guidelines for improved posture. Your physician or physical therapist will provide you with any instructions specific to your needs. While reading these guidelines, remember:  The exercises prescribed by your provider will help you have the flexibility and strength to maintain correct postures.  The correct posture provides the best environment for your joints to work. All of your joints have less wear and tear when properly supported by a spine with good posture. This means you will experience a healthier, less painful body.  Correct posture must be practiced with all of your activities, especially prolonged sitting and standing. Correct posture is as important when doing repetitive low-stress activities (typing) as it is when doing a single heavy-load  activity (lifting). RESTING POSITIONS Consider which positions are most painful for you when choosing a resting position. If you have pain with flexion-based activities (sitting, bending, stooping, squatting), choose a position that allows you to rest in a less flexed posture. You would want to avoid curling into a fetal position on your side. If your pain worsens with extension-based activities (prolonged standing, working overhead), avoid resting in an extended position such as sleeping on your stomach. Most people will find more comfort when they rest with their spine in a more neutral position, neither too rounded nor too arched. Lying on a non-sagging bed on your side with a pillow between your knees, or on your back with a pillow under your knees will often provide some relief. Keep in mind, being in any one position for a prolonged period of time, no matter how correct your posture, can still lead to stiffness. PROPER SITTING POSTURE In order to minimize stress and discomfort on your spine, you must sit with correct posture. Sitting with good posture should be effortless for a healthy body. Returning to good posture is a gradual process. Many people can work toward this most comfortably by using various supports until they have the flexibility and strength to maintain this posture on their own. When sitting with proper posture, your ears will fall over your shoulders and your shoulders will fall over your hips. You should use the back of the chair to support your upper back. Your lower back will be in a neutral position, just slightly arched. You may place a small pillow or folded towel at the base of your lower back for  support.  When working at a desk, create an environment that supports good, upright posture. Without extra support, muscles tire, which leads to excessive strain on joints and other tissues. Keep these recommendations in mind: CHAIR:  A chair should be able to slide under your desk  when your back makes contact with the back of the chair. This allows you to work closely.  The chair's height should allow your eyes to be level with the upper part of your monitor and your hands to be slightly lower than your elbows. BODY POSITION  Your feet should make contact with the floor. If this is not possible, use a foot rest.  Keep your   ears over your shoulders. This will reduce stress on your neck and low back. INCORRECT SITTING POSTURES  If you are feeling tired and unable to assume a healthy sitting posture, do not slouch or slump. This puts excessive strain on your back tissues, causing more damage and pain. Healthier options include:  Using more support, like a lumbar pillow.  Switching tasks to something that requires you to be upright or walking.  Talking a brief walk.  Lying down to rest in a neutral-spine position. PROLONGED STANDING WHILE SLIGHTLY LEANING FORWARD  When completing a task that requires you to lean forward while standing in one place for a long time, place either foot up on a stationary 2-4 inch high object to help maintain the best posture. When both feet are on the ground, the lower back tends to lose its slight inward curve. If this curve flattens (or becomes too large), then the back and your other joints will experience too much stress, tire more quickly, and can cause pain. CORRECT STANDING POSTURES Proper standing posture should be assumed with all daily activities, even if they only take a few moments, like when brushing your teeth. As in sitting, your ears should fall over your shoulders and your shoulders should fall over your hips. You should keep a slight tension in your abdominal muscles to brace your spine. Your tailbone should point down to the ground, not behind your body, resulting in an over-extended swayback posture.  INCORRECT STANDING POSTURES  Common incorrect standing postures include a forward head, locked knees and/or an excessive  swayback. WALKING Walk with an upright posture. Your ears, shoulders and hips should all line-up. PROLONGED ACTIVITY IN A FLEXED POSITION When completing a task that requires you to bend forward at your waist or lean over a low surface, try to find a way to stabilize 3 out of 4 of your limbs. You can place a hand or elbow on your thigh or rest a knee on the surface you are reaching across. This will provide you more stability, so that your muscles do not tire as quickly. By keeping your knees relaxed, or slightly bent, you will also reduce stress across your lower back. CORRECT LIFTING TECHNIQUES DO :  Assume a wide stance. This will provide you more stability and the opportunity to get as close as possible to the object which you are lifting.  Tense your abdominals to brace your spine. Bend at the knees and hips. Keeping your back locked in a neutral-spine position, lift using your leg muscles. Lift with your legs, keeping your back straight.  Test the weight of unknown objects before attempting to lift them.  Try to keep your elbows locked down at your sides in order get the best strength from your shoulders when carrying an object.  Always ask for help when lifting heavy or awkward objects. INCORRECT LIFTING TECHNIQUES DO NOT:   Lock your knees when lifting, even if it is a small object.  Bend and twist. Pivot at your feet or move your feet when needing to change directions.  Assume that you can safely pick up even a paperclip without proper posture. Document Released: 08/22/2005 Document Revised: 11/14/2011 Document Reviewed: 12/04/2008 [employer] Patient Information 2015 [employer]. This information is not intended to replace advice given to you by your health care provider. Make sure you discuss any questions you have with your health care provider.  

## 2015-05-29 ENCOUNTER — Ambulatory Visit: Payer: Self-pay | Admitting: Internal Medicine

## 2015-06-25 ENCOUNTER — Ambulatory Visit (INDEPENDENT_AMBULATORY_CARE_PROVIDER_SITE_OTHER): Admitting: Psychology

## 2015-06-25 DIAGNOSIS — F411 Generalized anxiety disorder: Secondary | ICD-10-CM | POA: Diagnosis not present

## 2015-07-06 ENCOUNTER — Ambulatory Visit (INDEPENDENT_AMBULATORY_CARE_PROVIDER_SITE_OTHER): Admitting: Psychology

## 2015-07-06 DIAGNOSIS — F411 Generalized anxiety disorder: Secondary | ICD-10-CM

## 2015-07-23 ENCOUNTER — Other Ambulatory Visit (INDEPENDENT_AMBULATORY_CARE_PROVIDER_SITE_OTHER)

## 2015-07-23 ENCOUNTER — Ambulatory Visit (INDEPENDENT_AMBULATORY_CARE_PROVIDER_SITE_OTHER): Admitting: Psychology

## 2015-07-23 DIAGNOSIS — F411 Generalized anxiety disorder: Secondary | ICD-10-CM | POA: Diagnosis not present

## 2015-07-23 DIAGNOSIS — Z0000 Encounter for general adult medical examination without abnormal findings: Secondary | ICD-10-CM | POA: Diagnosis not present

## 2015-07-23 LAB — LIPID PANEL
Cholesterol: 195 mg/dL (ref 0–200)
HDL: 56.3 mg/dL (ref >39.00)
LDL Cholesterol: 124 mg/dL — ABNORMAL HIGH (ref 0–99)
NONHDL: 139.13
Total CHOL/HDL Ratio: 3
Triglycerides: 75 mg/dL (ref 0.0–149.0)
VLDL: 15 mg/dL (ref 0.0–40.0)

## 2015-07-23 LAB — CBC WITH DIFFERENTIAL/PLATELET
BASOS ABS: 0 10*3/uL (ref 0.0–0.1)
Basophils Relative: 0.8 % (ref 0.0–3.0)
EOS PCT: 7.2 % — AB (ref 0.0–5.0)
Eosinophils Absolute: 0.3 10*3/uL (ref 0.0–0.7)
HEMATOCRIT: 38.4 % (ref 36.0–46.0)
Hemoglobin: 12.4 g/dL (ref 12.0–15.0)
LYMPHS PCT: 41.5 % (ref 12.0–46.0)
Lymphs Abs: 1.7 10*3/uL (ref 0.7–4.0)
MCHC: 32.3 g/dL (ref 30.0–36.0)
MCV: 88.8 fl (ref 78.0–100.0)
MONOS PCT: 8.6 % (ref 3.0–12.0)
Monocytes Absolute: 0.4 10*3/uL (ref 0.1–1.0)
Neutro Abs: 1.7 10*3/uL (ref 1.4–7.7)
Neutrophils Relative %: 41.9 % — ABNORMAL LOW (ref 43.0–77.0)
Platelets: 280 10*3/uL (ref 150.0–400.0)
RBC: 4.32 Mil/uL (ref 3.87–5.11)
RDW: 15.1 % (ref 11.5–15.5)
WBC: 4.2 10*3/uL (ref 4.0–10.5)

## 2015-07-23 LAB — HEPATIC FUNCTION PANEL
ALBUMIN: 4.4 g/dL (ref 3.5–5.2)
ALK PHOS: 35 U/L — AB (ref 39–117)
ALT: 13 U/L (ref 0–35)
AST: 19 U/L (ref 0–37)
Bilirubin, Direct: 0.1 mg/dL (ref 0.0–0.3)
TOTAL PROTEIN: 7.5 g/dL (ref 6.0–8.3)
Total Bilirubin: 0.6 mg/dL (ref 0.2–1.2)

## 2015-07-23 LAB — BASIC METABOLIC PANEL
BUN: 12 mg/dL (ref 6–23)
CALCIUM: 9.7 mg/dL (ref 8.4–10.5)
CO2: 26 meq/L (ref 19–32)
Chloride: 106 mEq/L (ref 96–112)
Creatinine, Ser: 0.99 mg/dL (ref 0.40–1.20)
GFR: 87.3 mL/min (ref >60.00)
Glucose, Bld: 81 mg/dL (ref 70–99)
POTASSIUM: 5.2 meq/L — AB (ref 3.5–5.1)
SODIUM: 139 meq/L (ref 135–145)

## 2015-07-23 LAB — TSH: TSH: 66.64 u[IU]/mL — AB (ref 0.35–4.50)

## 2015-07-29 ENCOUNTER — Encounter: Payer: Self-pay | Admitting: Internal Medicine

## 2015-07-29 ENCOUNTER — Telehealth: Payer: Self-pay | Admitting: Internal Medicine

## 2015-07-29 NOTE — Telephone Encounter (Signed)
Yes   Bu tplease tell her  In the meantime thryoid tsh level is way off (  66) Please make sure taking thyroid medication every day!

## 2015-07-29 NOTE — Telephone Encounter (Signed)
lmom for pt to call back

## 2015-07-29 NOTE — Telephone Encounter (Signed)
Pt had to cancelled her cpx today due to being call into work. Can I create 30 min slot before end of year?

## 2015-07-29 NOTE — Progress Notes (Signed)
Document opened and reviewed for OV but appt  canceled same day .  

## 2015-07-29 NOTE — Patient Instructions (Signed)
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 Take medication every day!    Preventive Care for Adults, Female A healthy lifestyle and preventive care can promote health and wellness. Preventive health guidelines for women include the following key practices.  A routine yearly physical is a good way to check with your health care provider about your health and preventive screening. It is a chance to share any concerns and updates on your health and to receive a thorough exam.  Visit your dentist for a routine exam and preventive care every 6 months. Brush your teeth twice a day and floss once a day. Good oral hygiene prevents tooth decay and gum disease.  The frequency of eye exams is based on your age, health, family medical history, use of contact lenses, and other factors. Follow your health care provider's recommendations for frequency of eye exams.  Eat a healthy diet. Foods like vegetables, fruits, whole grains, low-fat dairy products, and lean protein foods contain the nutrients you need without too many calories. Decrease your intake of foods high in solid fats, added sugars, and salt. Eat the right amount of calories for you.Get information about a proper diet from your health care provider, if necessary.  Regular physical exercise is one of the most important things you can do for your health. Most adults should get at least 150 minutes of moderate-intensity exercise (any activity that increases your heart rate and causes you to sweat) each week. In addition, most adults need muscle-strengthening exercises on 2 or more days a week.  Maintain a healthy weight. The body mass index (BMI) is a screening tool to identify possible weight problems. It provides an estimate of body fat based on height and weight. Your health care provider can find your BMI and can help you achieve or maintain a healthy weight.For adults 20 years and older:  A BMI below 18.5 is considered  underweight.  A BMI of 18.5 to 24.9 is normal.  A BMI of 25 to 29.9 is considered overweight.  A BMI of 30 and above is considered obese.  Maintain normal blood lipids and cholesterol levels by exercising and minimizing your intake of saturated fat. Eat a balanced diet with plenty of fruit and vegetables. Blood tests for lipids and cholesterol should begin at age 41 and be repeated every 5 years. If your lipid or cholesterol levels are high, you are over 50, or you are at high risk for heart disease, you may need your cholesterol levels checked more frequently.Ongoing high lipid and cholesterol levels should be treated with medicines if diet and exercise are not working.  If you smoke, find out from your health care provider how to quit. If you do not use tobacco, do not start.  Lung cancer screening is recommended for adults aged 97-80 years who are at high risk for developing lung cancer because of a history of smoking. A yearly low-dose CT scan of the lungs is recommended for people who have at least a 30-pack-year history of smoking and are a current smoker or have quit within the past 15 years. A pack year of smoking is smoking an average of 1 pack of cigarettes a day for 1 year (for example: 1 pack a day for 30 years or 2 packs a day for 15 years). Yearly screening should continue until the smoker has stopped smoking for at least 15 years. Yearly screening should be stopped for people who develop a health problem that would prevent them  from having lung cancer treatment.  If you are pregnant, do not drink alcohol. If you are breastfeeding, be very cautious about drinking alcohol. If you are not pregnant and choose to drink alcohol, do not have more than 1 drink per day. One drink is considered to be 12 ounces (355 mL) of beer, 5 ounces (148 mL) of wine, or 1.5 ounces (44 mL) of liquor.  Avoid use of street drugs. Do not share needles with anyone. Ask for help if you need support or  instructions about stopping the use of drugs.  High blood pressure causes heart disease and increases the risk of stroke. Your blood pressure should be checked at least every 1 to 2 years. Ongoing high blood pressure should be treated with medicines if weight loss and exercise do not work.  If you are 55-79 years old, ask your health care provider if you should take aspirin to prevent strokes.  Diabetes screening is done by taking a blood sample to check your blood glucose level after you have not eaten for a certain period of time (fasting). If you are not overweight and you do not have risk factors for diabetes, you should be screened once every 3 years starting at age 45. If you are overweight or obese and you are 40-70 years of age, you should be screened for diabetes every year as part of your cardiovascular risk assessment.  Breast cancer screening is essential preventive care for women. You should practice "breast self-awareness." This means understanding the normal appearance and feel of your breasts and may include breast self-examination. Any changes detected, no matter how small, should be reported to a health care provider. Women in their 20s and 30s should have a clinical breast exam (CBE) by a health care provider as part of a regular health exam every 1 to 3 years. After age 40, women should have a CBE every year. Starting at age 40, women should consider having a mammogram (breast X-ray test) every year. Women who have a family history of breast cancer should talk to their health care provider about genetic screening. Women at a high risk of breast cancer should talk to their health care providers about having an MRI and a mammogram every year.  Breast cancer gene (BRCA)-related cancer risk assessment is recommended for women who have family members with BRCA-related cancers. BRCA-related cancers include breast, ovarian, tubal, and peritoneal cancers. Having family members with these  cancers may be associated with an increased risk for harmful changes (mutations) in the breast cancer genes BRCA1 and BRCA2. Results of the assessment will determine the need for genetic counseling and BRCA1 and BRCA2 testing.  Your health care provider may recommend that you be screened regularly for cancer of the pelvic organs (ovaries, uterus, and vagina). This screening involves a pelvic examination, including checking for microscopic changes to the surface of your cervix (Pap test). You may be encouraged to have this screening done every 3 years, beginning at age 21.  For women ages 30-65, health care providers may recommend pelvic exams and Pap testing every 3 years, or they may recommend the Pap and pelvic exam, combined with testing for human papilloma virus (HPV), every 5 years. Some types of HPV increase your risk of cervical cancer. Testing for HPV may also be done on women of any age with unclear Pap test results.  Other health care providers may not recommend any screening for nonpregnant women who are considered low risk for pelvic cancer and who   do not have symptoms. Ask your health care provider if a screening pelvic exam is right for you.  If you have had past treatment for cervical cancer or a condition that could lead to cancer, you need Pap tests and screening for cancer for at least 20 years after your treatment. If Pap tests have been discontinued, your risk factors (such as having a new sexual partner) need to be reassessed to determine if screening should resume. Some women have medical problems that increase the chance of getting cervical cancer. In these cases, your health care provider may recommend more frequent screening and Pap tests.  Colorectal cancer can be detected and often prevented. Most routine colorectal cancer screening begins at the age of 50 years and continues through age 75 years. However, your health care provider may recommend screening at an earlier age if you  have risk factors for colon cancer. On a yearly basis, your health care provider may provide home test kits to check for hidden blood in the stool. Use of a small camera at the end of a tube, to directly examine the colon (sigmoidoscopy or colonoscopy), can detect the earliest forms of colorectal cancer. Talk to your health care provider about this at age 50, when routine screening begins. Direct exam of the colon should be repeated every 5-10 years through age 75 years, unless early forms of precancerous polyps or small growths are found.  People who are at an increased risk for hepatitis B should be screened for this virus. You are considered at high risk for hepatitis B if:  You were born in a country where hepatitis B occurs often. Talk with your health care provider about which countries are considered high risk.  Your parents were born in a high-risk country and you have not received a shot to protect against hepatitis B (hepatitis B vaccine).  You have HIV or AIDS.  You use needles to inject street drugs.  You live with, or have sex with, someone who has hepatitis B.  You get hemodialysis treatment.  You take certain medicines for conditions like cancer, organ transplantation, and autoimmune conditions.  Hepatitis C blood testing is recommended for all people born from 1945 through 1965 and any individual with known risks for hepatitis C.  Practice safe sex. Use condoms and avoid high-risk sexual practices to reduce the spread of sexually transmitted infections (STIs). STIs include gonorrhea, chlamydia, syphilis, trichomonas, herpes, HPV, and human immunodeficiency virus (HIV). Herpes, HIV, and HPV are viral illnesses that have no cure. They can result in disability, cancer, and death.  You should be screened for sexually transmitted illnesses (STIs) including gonorrhea and chlamydia if:  You are sexually active and are younger than 24 years.  You are older than 24 years and your  health care provider tells you that you are at risk for this type of infection.  Your sexual activity has changed since you were last screened and you are at an increased risk for chlamydia or gonorrhea. Ask your health care provider if you are at risk.  If you are at risk of being infected with HIV, it is recommended that you take a prescription medicine daily to prevent HIV infection. This is called preexposure prophylaxis (PrEP). You are considered at risk if:  You are sexually active and do not regularly use condoms or know the HIV status of your partner(s).  You take drugs by injection.  You are sexually active with a partner who has HIV.  Talk with   your health care provider about whether you are at high risk of being infected with HIV. If you choose to begin PrEP, you should first be tested for HIV. You should then be tested every 3 months for as long as you are taking PrEP.  Osteoporosis is a disease in which the bones lose minerals and strength with aging. This can result in serious bone fractures or breaks. The risk of osteoporosis can be identified using a bone density scan. Women ages 87 years and over and women at risk for fractures or osteoporosis should discuss screening with their health care providers. Ask your health care provider whether you should take a calcium supplement or vitamin D to reduce the rate of osteoporosis.  Menopause can be associated with physical symptoms and risks. Hormone replacement therapy is available to decrease symptoms and risks. You should talk to your health care provider about whether hormone replacement therapy is right for you.  Use sunscreen. Apply sunscreen liberally and repeatedly throughout the day. You should seek shade when your shadow is shorter than you. Protect yourself by wearing long sleeves, pants, a wide-brimmed hat, and sunglasses year round, whenever you are outdoors.  Once a month, do a whole body skin exam, using a mirror to look  at the skin on your back. Tell your health care provider of new moles, moles that have irregular borders, moles that are larger than a pencil eraser, or moles that have changed in shape or color.  Stay current with required vaccines (immunizations).  Influenza vaccine. All adults should be immunized every year.  Tetanus, diphtheria, and acellular pertussis (Td, Tdap) vaccine. Pregnant women should receive 1 dose of Tdap vaccine during each pregnancy. The dose should be obtained regardless of the length of time since the last dose. Immunization is preferred during the 27th-36th week of gestation. An adult who has not previously received Tdap or who does not know her vaccine status should receive 1 dose of Tdap. This initial dose should be followed by tetanus and diphtheria toxoids (Td) booster doses every 10 years. Adults with an unknown or incomplete history of completing a 3-dose immunization series with Td-containing vaccines should begin or complete a primary immunization series including a Tdap dose. Adults should receive a Td booster every 10 years.  Varicella vaccine. An adult without evidence of immunity to varicella should receive 2 doses or a second dose if she has previously received 1 dose. Pregnant females who do not have evidence of immunity should receive the first dose after pregnancy. This first dose should be obtained before leaving the health care facility. The second dose should be obtained 4-8 weeks after the first dose.  Human papillomavirus (HPV) vaccine. Females aged 13-26 years who have not received the vaccine previously should obtain the 3-dose series. The vaccine is not recommended for use in pregnant females. However, pregnancy testing is not needed before receiving a dose. If a female is found to be pregnant after receiving a dose, no treatment is needed. In that case, the remaining doses should be delayed until after the pregnancy. Immunization is recommended for any person  with an immunocompromised condition through the age of 24 years if she did not get any or all doses earlier. During the 3-dose series, the second dose should be obtained 4-8 weeks after the first dose. The third dose should be obtained 24 weeks after the first dose and 16 weeks after the second dose.  Zoster vaccine. One dose is recommended for adults aged 45  years or older unless certain conditions are present.  Measles, mumps, and rubella (MMR) vaccine. Adults born before 1957 generally are considered immune to measles and mumps. Adults born in 1957 or later should have 1 or more doses of MMR vaccine unless there is a contraindication to the vaccine or there is laboratory evidence of immunity to each of the three diseases. A routine second dose of MMR vaccine should be obtained at least 28 days after the first dose for students attending postsecondary schools, health care workers, or international travelers. People who received inactivated measles vaccine or an unknown type of measles vaccine during 1963-1967 should receive 2 doses of MMR vaccine. People who received inactivated mumps vaccine or an unknown type of mumps vaccine before 1979 and are at high risk for mumps infection should consider immunization with 2 doses of MMR vaccine. For females of childbearing age, rubella immunity should be determined. If there is no evidence of immunity, females who are not pregnant should be vaccinated. If there is no evidence of immunity, females who are pregnant should delay immunization until after pregnancy. Unvaccinated health care workers born before 1957 who lack laboratory evidence of measles, mumps, or rubella immunity or laboratory confirmation of disease should consider measles and mumps immunization with 2 doses of MMR vaccine or rubella immunization with 1 dose of MMR vaccine.  Pneumococcal 13-valent conjugate (PCV13) vaccine. When indicated, a person who is uncertain of his immunization history and has  no record of immunization should receive the PCV13 vaccine. All adults 65 years of age and older should receive this vaccine. An adult aged 19 years or older who has certain medical conditions and has not been previously immunized should receive 1 dose of PCV13 vaccine. This PCV13 should be followed with a dose of pneumococcal polysaccharide (PPSV23) vaccine. Adults who are at high risk for pneumococcal disease should obtain the PPSV23 vaccine at least 8 weeks after the dose of PCV13 vaccine. Adults older than 25 years of age who have normal immune system function should obtain the PPSV23 vaccine dose at least 1 year after the dose of PCV13 vaccine.  Pneumococcal polysaccharide (PPSV23) vaccine. When PCV13 is also indicated, PCV13 should be obtained first. All adults aged 65 years and older should be immunized. An adult younger than age 65 years who has certain medical conditions should be immunized. Any person who resides in a nursing home or long-term care facility should be immunized. An adult smoker should be immunized. People with an immunocompromised condition and certain other conditions should receive both PCV13 and PPSV23 vaccines. People with human immunodeficiency virus (HIV) infection should be immunized as soon as possible after diagnosis. Immunization during chemotherapy or radiation therapy should be avoided. Routine use of PPSV23 vaccine is not recommended for American Indians, [place] Natives, or people younger than 65 years unless there are medical conditions that require PPSV23 vaccine. When indicated, people who have unknown immunization and have no record of immunization should receive PPSV23 vaccine. One-time revaccination 5 years after the first dose of PPSV23 is recommended for people aged 19-64 years who have chronic kidney failure, nephrotic syndrome, asplenia, or immunocompromised conditions. People who received 1-2 doses of PPSV23 before age 65 years should receive another dose of PPSV23  vaccine at age 65 years or later if at least 5 years have passed since the previous dose. Doses of PPSV23 are not needed for people immunized with PPSV23 at or after age 65 years.  Meningococcal vaccine. Adults with asplenia or persistent complement   component deficiencies should receive 2 doses of quadrivalent meningococcal conjugate (MenACWY-D) vaccine. The doses should be obtained at least 2 months apart. Microbiologists working with certain meningococcal bacteria, military recruits, people at risk during an outbreak, and people who travel to or live in countries with a high rate of meningitis should be immunized. A first-year college student up through age 21 years who is living in a residence hall should receive a dose if she did not receive a dose on or after her 16th birthday. Adults who have certain high-risk conditions should receive one or more doses of vaccine.  Hepatitis A vaccine. Adults who wish to be protected from this disease, have certain high-risk conditions, work with hepatitis A-infected animals, work in hepatitis A research labs, or travel to or work in countries with a high rate of hepatitis A should be immunized. Adults who were previously unvaccinated and who anticipate close contact with an international adoptee during the first 60 days after arrival in the [place] from a country with a high rate of hepatitis A should be immunized.  Hepatitis B vaccine. Adults who wish to be protected from this disease, have certain high-risk conditions, may be exposed to blood or other infectious body fluids, are household contacts or sex partners of hepatitis B positive people, are clients or workers in certain care facilities, or travel to or work in countries with a high rate of hepatitis B should be immunized.  Haemophilus influenzae type b (Hib) vaccine. A previously unvaccinated person with asplenia or sickle cell disease or having a scheduled splenectomy should receive 1 dose of Hib  vaccine. Regardless of previous immunization, a recipient of a hematopoietic stem cell transplant should receive a 3-dose series 6-12 months after her successful transplant. Hib vaccine is not recommended for adults with HIV infection. Preventive Services / Frequency Ages 19 to 39 years  Blood pressure check.** / Every 3-5 years.  Lipid and cholesterol check.** / Every 5 years beginning at age 20.  Clinical breast exam.** / Every 3 years for women in their 20s and 30s.  BRCA-related cancer risk assessment.** / For women who have family members with a BRCA-related cancer (breast, ovarian, tubal, or peritoneal cancers).  Pap test.** / Every 2 years from ages 21 through 29. Every 3 years starting at age 30 through age 65 or 70 with a history of 3 consecutive normal Pap tests.  HPV screening.** / Every 3 years from ages 30 through ages 65 to 70 with a history of 3 consecutive normal Pap tests.  Hepatitis C blood test.** / For any individual with known risks for hepatitis C.  Skin self-exam. / Monthly.  Influenza vaccine. / Every year.  Tetanus, diphtheria, and acellular pertussis (Tdap, Td) vaccine.** / Consult your health care provider. Pregnant women should receive 1 dose of Tdap vaccine during each pregnancy. 1 dose of Td every 10 years.  Varicella vaccine.** / Consult your health care provider. Pregnant females who do not have evidence of immunity should receive the first dose after pregnancy.  HPV vaccine. / 3 doses over 6 months, if 26 and younger. The vaccine is not recommended for use in pregnant females. However, pregnancy testing is not needed before receiving a dose.  Measles, mumps, rubella (MMR) vaccine.** / You need at least 1 dose of MMR if you were born in 1957 or later. You may also need a 2nd dose. For females of childbearing age, rubella immunity should be determined. If there is no evidence of immunity, females   who are not pregnant should be vaccinated. If there is no  evidence of immunity, females who are pregnant should delay immunization until after pregnancy.  Pneumococcal 13-valent conjugate (PCV13) vaccine.** / Consult your health care provider.  Pneumococcal polysaccharide (PPSV23) vaccine.** / 1 to 2 doses if you smoke cigarettes or if you have certain conditions.  Meningococcal vaccine.** / 1 dose if you are age 19 to 21 years and a first-year college student living in a residence hall, or have one of several medical conditions, you need to get vaccinated against meningococcal disease. You may also need additional booster doses.  Hepatitis A vaccine.** / Consult your health care provider.  Hepatitis B vaccine.** / Consult your health care provider.  Haemophilus influenzae type b (Hib) vaccine.** / Consult your health care provider. Ages 40 to 64 years  Blood pressure check.** / Every year.  Lipid and cholesterol check.** / Every 5 years beginning at age 20 years.  Lung cancer screening. / Every year if you are aged 55-80 years and have a 30-pack-year history of smoking and currently smoke or have quit within the past 15 years. Yearly screening is stopped once you have quit smoking for at least 15 years or develop a health problem that would prevent you from having lung cancer treatment.  Clinical breast exam.** / Every year after age 40 years.  BRCA-related cancer risk assessment.** / For women who have family members with a BRCA-related cancer (breast, ovarian, tubal, or peritoneal cancers).  Mammogram.** / Every year beginning at age 40 years and continuing for as long as you are in good health. Consult with your health care provider.  Pap test.** / Every 3 years starting at age 30 years through age 65 or 70 years with a history of 3 consecutive normal Pap tests.  HPV screening.** / Every 3 years from ages 30 years through ages 65 to 70 years with a history of 3 consecutive normal Pap tests.  Fecal occult blood test (FOBT) of stool. /  Every year beginning at age 50 years and continuing until age 75 years. You may not need to do this test if you get a colonoscopy every 10 years.  Flexible sigmoidoscopy or colonoscopy.** / Every 5 years for a flexible sigmoidoscopy or every 10 years for a colonoscopy beginning at age 50 years and continuing until age 75 years.  Hepatitis C blood test.** / For all people born from 1945 through 1965 and any individual with known risks for hepatitis C.  Skin self-exam. / Monthly.  Influenza vaccine. / Every year.  Tetanus, diphtheria, and acellular pertussis (Tdap/Td) vaccine.** / Consult your health care provider. Pregnant women should receive 1 dose of Tdap vaccine during each pregnancy. 1 dose of Td every 10 years.  Varicella vaccine.** / Consult your health care provider. Pregnant females who do not have evidence of immunity should receive the first dose after pregnancy.  Zoster vaccine.** / 1 dose for adults aged 60 years or older.  Measles, mumps, rubella (MMR) vaccine.** / You need at least 1 dose of MMR if you were born in 1957 or later. You may also need a second dose. For females of childbearing age, rubella immunity should be determined. If there is no evidence of immunity, females who are not pregnant should be vaccinated. If there is no evidence of immunity, females who are pregnant should delay immunization until after pregnancy.  Pneumococcal 13-valent conjugate (PCV13) vaccine.** / Consult your health care provider.  Pneumococcal polysaccharide (PPSV23) vaccine.** / 1   to 2 doses if you smoke cigarettes or if you have certain conditions.  Meningococcal vaccine.** / Consult your health care provider.  Hepatitis A vaccine.** / Consult your health care provider.  Hepatitis B vaccine.** / Consult your health care provider.  Haemophilus influenzae type b (Hib) vaccine.** / Consult your health care provider. Ages 33 years and over  Blood pressure check.** / Every year.  Lipid  and cholesterol check.** / Every 5 years beginning at age 25 years.  Lung cancer screening. / Every year if you are aged 67-80 years and have a 30-pack-year history of smoking and currently smoke or have quit within the past 15 years. Yearly screening is stopped once you have quit smoking for at least 15 years or develop a health problem that would prevent you from having lung cancer treatment.  Clinical breast exam.** / Every year after age 39 years.  BRCA-related cancer risk assessment.** / For women who have family members with a BRCA-related cancer (breast, ovarian, tubal, or peritoneal cancers).  Mammogram.** / Every year beginning at age 56 years and continuing for as long as you are in good health. Consult with your health care provider.  Pap test.** / Every 3 years starting at age 22 years through age 74 or 48 years with 3 consecutive normal Pap tests. Testing can be stopped between 65 and 70 years with 3 consecutive normal Pap tests and no abnormal Pap or HPV tests in the past 10 years.  HPV screening.** / Every 3 years from ages 69 years through ages 76 or 69 years with a history of 3 consecutive normal Pap tests. Testing can be stopped between 65 and 70 years with 3 consecutive normal Pap tests and no abnormal Pap or HPV tests in the past 10 years.  Fecal occult blood test (FOBT) of stool. / Every year beginning at age 43 years and continuing until age 59 years. You may not need to do this test if you get a colonoscopy every 10 years.  Flexible sigmoidoscopy or colonoscopy.** / Every 5 years for a flexible sigmoidoscopy or every 10 years for a colonoscopy beginning at age 78 years and continuing until age 88 years.  Hepatitis C blood test.** / For all people born from [birth year] through 1966 and any individual with known risks for hepatitis C.  Osteoporosis screening.** / A one-time screening for women ages 71 years and over and women at risk for fractures or osteoporosis.  Skin  self-exam. / Monthly.  Influenza vaccine. / Every year.  Tetanus, diphtheria, and acellular pertussis (Tdap/Td) vaccine.** / 1 dose of Td every 10 years.  Varicella vaccine.** / Consult your health care provider.  Zoster vaccine.** / 1 dose for adults aged 62 years or older.  Pneumococcal 13-valent conjugate (PCV13) vaccine.** / Consult your health care provider.  Pneumococcal polysaccharide (PPSV23) vaccine.** / 1 dose for all adults aged 32 years and older.  Meningococcal vaccine.** / Consult your health care provider.  Hepatitis A vaccine.** / Consult your health care provider.  Hepatitis B vaccine.** / Consult your health care provider.  Haemophilus influenzae type b (Hib) vaccine.** / Consult your health care provider. ** Family history and personal history of risk and conditions may change your health care provider's recommendations.   This information is not intended to replace advice given to you by your health care provider. Make sure you discuss any questions you have with your health care provider.   Document Released: 10/18/2001 Document Revised: 09/12/2014 Document Reviewed: 01/17/2011 Elsevier Interactive  Patient Education 2016 [employer].

## 2015-08-05 NOTE — Telephone Encounter (Signed)
lmom for pt to call back

## 2015-08-10 NOTE — Telephone Encounter (Signed)
lmom for pt to call back

## 2015-08-18 ENCOUNTER — Encounter: Payer: Self-pay | Admitting: Internal Medicine

## 2015-08-18 ENCOUNTER — Ambulatory Visit (INDEPENDENT_AMBULATORY_CARE_PROVIDER_SITE_OTHER): Admitting: Internal Medicine

## 2015-08-18 VITALS — BP 96/60 | Temp 98.5°F | Ht 63.0 in | Wt 139.0 lb

## 2015-08-18 DIAGNOSIS — Z79899 Other long term (current) drug therapy: Secondary | ICD-10-CM

## 2015-08-18 DIAGNOSIS — E039 Hypothyroidism, unspecified: Secondary | ICD-10-CM | POA: Diagnosis not present

## 2015-08-18 DIAGNOSIS — Z3041 Encounter for surveillance of contraceptive pills: Secondary | ICD-10-CM

## 2015-08-18 DIAGNOSIS — F4323 Adjustment disorder with mixed anxiety and depressed mood: Secondary | ICD-10-CM | POA: Diagnosis not present

## 2015-08-18 DIAGNOSIS — Z23 Encounter for immunization: Secondary | ICD-10-CM | POA: Diagnosis not present

## 2015-08-18 DIAGNOSIS — Z0000 Encounter for general adult medical examination without abnormal findings: Secondary | ICD-10-CM

## 2015-08-18 MED ORDER — LEVOTHYROXINE SODIUM 150 MCG PO TABS: 150.0000 ug | ORAL_TABLET | Freq: Every day | ORAL | Status: DC

## 2015-08-18 NOTE — Patient Instructions (Addendum)
Check tsh in about 2 months  ( taking daily ) if doing ok no need for OV  Lab only. Can consider  mirena IUD  3-5 years or progesterone implant  3 years   See gyne for  Insertion  Good options for you.  Continue counseling as planned. Glad you are doing better.  Have pharmacy send  Request for refills electronically if needed. ROV in 6 months but can contact us by mychart and if doing well can see you in a year.  At check up .  Health Maintenance, Female Adopting a healthy lifestyle and getting preventive care can go a long way to promote health and wellness. Talk with your health care provider about what schedule of regular examinations is right for you. This is a good chance for you to check in with your provider about disease prevention and staying healthy. In between checkups, there are plenty of things you can do on your own. Experts have done a lot of research about which lifestyle changes and preventive measures are most likely to keep you healthy. Ask your health care provider for more information. WEIGHT AND DIET  Eat a healthy diet  Be sure to include plenty of vegetables, fruits, low-fat dairy products, and lean protein.  Do not eat a lot of foods high in solid fats, added sugars, or salt.  Get regular exercise. This is one of the most important things you can do for your health.  Most adults should exercise for at least 150 minutes each week. The exercise should increase your heart rate and make you sweat (moderate-intensity exercise).  Most adults should also do strengthening exercises at least twice a week. This is in addition to the moderate-intensity exercise.  Maintain a healthy weight  Body mass index (BMI) is a measurement that can be used to identify possible weight problems. It estimates body fat based on height and weight. Your health care provider can help determine your BMI and help you achieve or maintain a healthy weight.  For females 13 years of age and older:    A BMI below 18.5 is considered underweight.  A BMI of 18.5 to 24.9 is normal.  A BMI of 25 to 29.9 is considered overweight.  A BMI of 30 and above is considered obese.  Watch levels of cholesterol and blood lipids  You should start having your blood tested for lipids and cholesterol at 25 years of age, then have this test every 5 years.  You may need to have your cholesterol levels checked more often if:  Your lipid or cholesterol levels are high.  You are older than 25 years of age.  You are at high risk for heart disease.  CANCER SCREENING   Lung Cancer  Lung cancer screening is recommended for adults 26-78 years old who are at high risk for lung cancer because of a history of smoking.  A yearly low-dose CT scan of the lungs is recommended for people who:  Currently smoke.  Have quit within the past 15 years.  Have at least a 30-pack-year history of smoking. A pack year is smoking an average of one pack of cigarettes a day for 1 year.  Yearly screening should continue until it has been 15 years since you quit.  Yearly screening should stop if you develop a health problem that would prevent you from having lung cancer treatment.  Breast Cancer  Practice breast self-awareness. This means understanding how your breasts normally appear and feel.  It  also means doing regular breast self-exams. Let your health care provider know about any changes, no matter how small.  If you are in your 20s or 30s, you should have a clinical breast exam (CBE) by a health care provider every 1-3 years as part of a regular health exam.  If you are 54 or older, have a CBE every year. Also consider having a breast X-ray (mammogram) every year.  If you have a family history of breast cancer, talk to your health care provider about genetic screening.  If you are at high risk for breast cancer, talk to your health care provider about having an MRI and a mammogram every year.  Breast  cancer gene (BRCA) assessment is recommended for women who have family members with BRCA-related cancers. BRCA-related cancers include:  Breast.  Ovarian.  Tubal.  Peritoneal cancers.  Results of the assessment will determine the need for genetic counseling and BRCA1 and BRCA2 testing. Cervical Cancer Your health care provider may recommend that you be screened regularly for cancer of the pelvic organs (ovaries, uterus, and vagina). This screening involves a pelvic examination, including checking for microscopic changes to the surface of your cervix (Pap test). You may be encouraged to have this screening done every 3 years, beginning at age 72.  For women ages 68-65, health care providers may recommend pelvic exams and Pap testing every 3 years, or they may recommend the Pap and pelvic exam, combined with testing for human papilloma virus (HPV), every 5 years. Some types of HPV increase your risk of cervical cancer. Testing for HPV may also be done on women of any age with unclear Pap test results.  Other health care providers may not recommend any screening for nonpregnant women who are considered low risk for pelvic cancer and who do not have symptoms. Ask your health care provider if a screening pelvic exam is right for you.  If you have had past treatment for cervical cancer or a condition that could lead to cancer, you need Pap tests and screening for cancer for at least 20 years after your treatment. If Pap tests have been discontinued, your risk factors (such as having a new sexual partner) need to be reassessed to determine if screening should resume. Some women have medical problems that increase the chance of getting cervical cancer. In these cases, your health care provider may recommend more frequent screening and Pap tests. Colorectal Cancer  This type of cancer can be detected and often prevented.  Routine colorectal cancer screening usually begins at 25 years of age and  continues through 25 years of age.  Your health care provider may recommend screening at an earlier age if you have risk factors for colon cancer.  Your health care provider may also recommend using home test kits to check for hidden blood in the stool.  A small camera at the end of a tube can be used to examine your colon directly (sigmoidoscopy or colonoscopy). This is done to check for the earliest forms of colorectal cancer.  Routine screening usually begins at age 74.  Direct examination of the colon should be repeated every 5-10 years through 25 years of age. However, you may need to be screened more often if early forms of precancerous polyps or small growths are found. Skin Cancer  Check your skin from head to toe regularly.  Tell your health care provider about any new moles or changes in moles, especially if there is a change in a mole's  shape or color.  Also tell your health care provider if you have a mole that is larger than the size of a pencil eraser.  Always use sunscreen. Apply sunscreen liberally and repeatedly throughout the day.  Protect yourself by wearing long sleeves, pants, a wide-brimmed hat, and sunglasses whenever you are outside. HEART DISEASE, DIABETES, AND HIGH BLOOD PRESSURE   High blood pressure causes heart disease and increases the risk of stroke. High blood pressure is more likely to develop in:  People who have blood pressure in the high end of the normal range (130-139/85-89 mm Hg).  People who are overweight or obese.  People who are African American.  If you are 47-27 years of age, have your blood pressure checked every 3-5 years. If you are 55 years of age or older, have your blood pressure checked every year. You should have your blood pressure measured twice--once when you are at a hospital or clinic, and once when you are not at a hospital or clinic. Record the average of the two measurements. To check your blood pressure when you are not at  a hospital or clinic, you can use:  An automated blood pressure machine at a pharmacy.  A home blood pressure monitor.  If you are between 55 years and 1 years old, ask your health care provider if you should take aspirin to prevent strokes.  Have regular diabetes screenings. This involves taking a blood sample to check your fasting blood sugar level.  If you are at a normal weight and have a low risk for diabetes, have this test once every three years after 25 years of age.  If you are overweight and have a high risk for diabetes, consider being tested at a younger age or more often. PREVENTING INFECTION  Hepatitis B  If you have a higher risk for hepatitis B, you should be screened for this virus. You are considered at high risk for hepatitis B if:  You were born in a country where hepatitis B is common. Ask your health care provider which countries are considered high risk.  Your parents were born in a high-risk country, and you have not been immunized against hepatitis B (hepatitis B vaccine).  You have HIV or AIDS.  You use needles to inject street drugs.  You live with someone who has hepatitis B.  You have had sex with someone who has hepatitis B.  You get hemodialysis treatment.  You take certain medicines for conditions, including cancer, organ transplantation, and autoimmune conditions. Hepatitis C  Blood testing is recommended for:  Everyone born from [birth year] through 1966.  Anyone with known risk factors for hepatitis C. Sexually transmitted infections (STIs)  You should be screened for sexually transmitted infections (STIs) including gonorrhea and chlamydia if:  You are sexually active and are younger than 25 years of age.  You are older than 25 years of age and your health care provider tells you that you are at risk for this type of infection.  Your sexual activity has changed since you were last screened and you are at an increased risk for chlamydia or  gonorrhea. Ask your health care provider if you are at risk.  If you do not have HIV, but are at risk, it may be recommended that you take a prescription medicine daily to prevent HIV infection. This is called pre-exposure prophylaxis (PrEP). You are considered at risk if:  You are sexually active and do not regularly use condoms or know the  HIV status of your partner(s).  You take drugs by injection.  You are sexually active with a partner who has HIV. Talk with your health care provider about whether you are at high risk of being infected with HIV. If you choose to begin PrEP, you should first be tested for HIV. You should then be tested every 3 months for as long as you are taking PrEP.  PREGNANCY   If you are premenopausal and you may become pregnant, ask your health care provider about preconception counseling.  If you may become pregnant, take 400 to 800 micrograms (mcg) of folic acid every day.  If you want to prevent pregnancy, talk to your health care provider about birth control (contraception). OSTEOPOROSIS AND MENOPAUSE   Osteoporosis is a disease in which the bones lose minerals and strength with aging. This can result in serious bone fractures. Your risk for osteoporosis can be identified using a bone density scan.  If you are 24 years of age or older, or if you are at risk for osteoporosis and fractures, ask your health care provider if you should be screened.  Ask your health care provider whether you should take a calcium or vitamin D supplement to lower your risk for osteoporosis.  Menopause may have certain physical symptoms and risks.  Hormone replacement therapy may reduce some of these symptoms and risks. Talk to your health care provider about whether hormone replacement therapy is right for you.  HOME CARE INSTRUCTIONS   Schedule regular health, dental, and eye exams.  Stay current with your immunizations.   Do not use any tobacco products including  cigarettes, chewing tobacco, or electronic cigarettes.  If you are pregnant, do not drink alcohol.  If you are breastfeeding, limit how much and how often you drink alcohol.  Limit alcohol intake to no more than 1 drink per day for nonpregnant women. One drink equals 12 ounces of beer, 5 ounces of wine, or 1 ounces of hard liquor.  Do not use street drugs.  Do not share needles.  Ask your health care provider for help if you need support or information about quitting drugs.  Tell your health care provider if you often feel depressed.  Tell your health care provider if you have ever been abused or do not feel safe at home.   This information is not intended to replace advice given to you by your health care provider. Make sure you discuss any questions you have with your health care provider.   Document Released: 03/07/2011 Document Revised: 09/12/2014 Document Reviewed: 07/24/2013 Elsevier Interactive Patient Education [employer].

## 2015-08-18 NOTE — Progress Notes (Signed)
Pre visit review using our clinic review tool, if applicable. No additional management support is needed unless otherwise documented below in the visit note.  Chief Complaint  Patient presents with  . Annual Exam    ocps  sertraline  . Hypothyroidism    HPI: Patient  Bethany Jefferson  24 y.o. comes in today for Preventive Health Care visit  Thyroid : hard time remembering med before last labs   Now better for last 2 weeks and feeling better ocps help periods  Ask about long acitn g mthods cause of hard to take pillss on time Sertraline restarted and helping  Counseling with dr Glennon Hamilton and helping t No living home and  Back to school [employer] to study health admin  Back ok  Check spot under left arm pit  Health Maintenance  Topic Date Due  . TETANUS/TDAP  09/16/2008  . PAP SMEAR  04/01/2016  . INFLUENZA VACCINE  04/05/2016  . HIV Screening  Completed   Health Maintenance Review LIFESTYLE:  Exercise:  Not recent Tobacco/ETS:n Alcohol:  rare Sugar beverages:n Sleep:ok  Drug use: no   ROS:  GEN/ HEENT: No fever, significant weight changes sweats headaches vision problems hearing changes, CV/ PULM; No chest pain shortness of breath cough, syncope,edema  change in exercise tolerance. GI /GU: No adominal pain, vomiting, change in bowel habits. No blood in the stool. No significant GU symptoms. SKIN/HEME: ,no acute skin rashes suspicious lesions or bleeding. No lymphadenopathy, nodules, masses.  NEURO/ PSYCH:  No neurologic signs such as weakness numbness. No depression anxiety. IMM/ Allergy: No unusual infections.  Allergy .   REST of 12 system review negative except as per HPI   Past Medical History  Diagnosis Date  . Asthma     as a child quiescent  . History of varicella   . Scoliosis     fusion rod  2002  . Hyperthyroidism     rai ablation 8 12   . Anxiety   . Depression     Past Surgical History  Procedure Laterality Date  . Spinal fusion  2002    Dr. [facility], rod  . Incision and drainage abscess Right 10/30/2013    Procedure: INCISION AND DRAINAGE ABSCESS RIGHT AXILLARY;  Surgeon: Leighton Ruff, MD;  Location: [facility];  Service: General;  Laterality: Right;    Family History  Problem Relation Age of Onset  . GER disease Mother   . Hypertension Mother   . Hyperlipidemia Father   . Graves' disease Father   . Thyroid disease Father   . Hypertension Father   . Crohn's disease Sister     Social History   Social History  . Marital Status: Single    Spouse Name: N/A  . Number of Children: N/A  . Years of Education: N/A   Social History Main Topics  . Smoking status: Never Smoker   . Smokeless tobacco: Never Used  . Alcohol Use: 0.0 oz/week    0 Standard drinks or equivalent per week     Comment: socially  . Drug Use: No  . Sexual Activity: Not Asked   Other Topics Concern  . None   Social History Narrative   Single   HH of 4   Did one year at [employer]  Transferred  to    [place] .   Rising junior social work programs Did on [place] school after 90 hours now home planning going  to [employer] or local to finish degree stress job dietary service  .    Mom a nurse at [employer]   To do [facility] admin [employer]        EXAM:  BP 96/60 mmHg  Temp(Src) 98.5 F (36.9 C) (Oral)  Ht 5' 3"  (1.6 m)  Wt 139 lb (63.05 kg)  BMI 24.63 kg/m2  Body mass index is 24.63 kg/(m^2).  Physical Exam: Vital signs reviewed WUJ:WJXB is a well-developed well-nourished alert cooperative    who appearsr stated age in no acute distress.  HEENT: normocephalic atraumatic , Eyes: PERRL EOM's full, conjunctiva clear, Nares: paten,t no deformity discharge or tenderness., Ears: no deformity EAC's clear TMs with normal landmarks. Mouth: clear OP, no lesions, edema.  Moist mucous membranes. Dentition in adequate repair. NECK: supple without masses, thyromegaly or bruits. CHEST/PULM:  Clear to auscultation and percussion  breath sounds equal no wheeze , rales or rhonchi. No chest wall deformities or tenderness. CV: PMI is nondisplaced, S1 S2 no gallops, murmurs, rubs. Peripheral pulses are full without delay.No JVD . Breast: normal by inspection . No dimpling, discharge, masses, tenderness or discharge . ABDOMEN: Bowel sounds normal nontender  No guard or rebound, no hepato splenomegal no CVA tenderness.  No hernia. Extremtities:  No clubbing cyanosis or edema, no acute joint swelling or redness no focal atrophy NEURO:  Oriented x3, cranial nerves 3-12 appear to be intact, no obvious focal weakness,gait within normal limits no abnormal reflexes or asymmetrical SKIN: No acute rashes normal turgor, color, no bruising or petechiae. Left axill a sunken skin pore with cellular degri extruded no irritaion or redness or infection PSYCH: Oriented, good eye contact, no obvious depression anxiety, cognition and judgment appear normal. LN: no cervical axillary inguinal adenopathy  Lab Results  Component Value Date   WBC 4.2 07/23/2015   HGB 12.4 07/23/2015   HCT 38.4 07/23/2015   PLT 280.0 07/23/2015   GLUCOSE 81 07/23/2015   CHOL 195 07/23/2015   TRIG 75.0 07/23/2015   HDL 56.30 07/23/2015   LDLCALC 124* 07/23/2015   ALT 13 07/23/2015   AST 19 07/23/2015   NA 139 07/23/2015   K 5.2* 07/23/2015   CL 106 07/23/2015   CREATININE 0.99 07/23/2015   BUN 12 07/23/2015   CO2 26 07/23/2015   TSH 66.64* 07/23/2015    ASSESSMENT AND PLAN:  Discussed the following assessment and plan:  Visit for preventive health examination  Hypothyroidism, unspecified hypothyroidism type - disc ompliance adheracne check tsh after 6- 8 weeks disc  Need for prophylactic vaccination and inoculation against influenza - Plan: Flu Vaccine QUAD 36+ mos PF IM (Fluarix & Fluzone Quad PF)  Oral contraceptive use - dsic other optinos  Medication management  Adjustment disorder with mixed anxiety and depressed mood - stable cont  counseling med If  tsh gets better     rov in 6 months  Or if wellthen yearly check up.  Use mychart if needed in interim  Follow skin area  Observe if progressive then recheck  Patient Care Team: Burnis Medin, MD as PCP - General Renato Shin, MD as Attending Physician (Endocrinology) Patient Instructions   Check tsh in about 2 months  ( taking daily ) if doing ok no need for OV  Lab only. Can consider  mirena IUD  3-5 years or progesterone implant  3 years   See gyne for  Insertion  Good options for you.  Continue counseling as planned. Glad you are doing better.  Have pharmacy send  Request for refills electronically if needed. ROV in 6 months but can contact us by mychart and if doing well can see you in a year.  At check up .  Health Maintenance, Female Adopting a healthy lifestyle and getting preventive care can go a long way to promote health and wellness. Talk with your health care provider about what schedule of regular examinations is right for you. This is a good chance for you to check in with your provider about disease prevention and staying healthy. In between checkups, there are plenty of things you can do on your own. Experts have done a lot of research about which lifestyle changes and preventive measures are most likely to keep you healthy. Ask your health care provider for more information. WEIGHT AND DIET  Eat a healthy diet  Be sure to include plenty of vegetables, fruits, low-fat dairy products, and lean protein.  Do not eat a lot of foods high in solid fats, added sugars, or salt.  Get regular exercise. This is one of the most important things you can do for your health.  Most adults should exercise for at least 150 minutes each week. The exercise should increase your heart rate and make you sweat (moderate-intensity exercise).  Most adults should also do strengthening exercises at least twice a week. This is in addition to the moderate-intensity exercise.   Maintain a healthy weight  Body mass index (BMI) is a measurement that can be used to identify possible weight problems. It estimates body fat based on height and weight. Your health care provider can help determine your BMI and help you achieve or maintain a healthy weight.  For females 25 years of age and older:   A BMI below 18.5 is considered underweight.  A BMI of 18.5 to 24.9 is normal.  A BMI of 25 to 29.9 is considered overweight.  A BMI of 30 and above is considered obese.  Watch levels of cholesterol and blood lipids  You should start having your blood tested for lipids and cholesterol at 25 years of age, then have this test every 5 years.  You may need to have your cholesterol levels checked more often if:  Your lipid or cholesterol levels are high.  You are older than 25 years of age.  You are at high risk for heart disease.  CANCER SCREENING   Lung Cancer  Lung cancer screening is recommended for adults 65-73 years old who are at high risk for lung cancer because of a history of smoking.  A yearly low-dose CT scan of the lungs is recommended for people who:  Currently smoke.  Have quit within the past 15 years.  Have at least a 30-pack-year history of smoking. A pack year is smoking an average of one pack of cigarettes a day for 1 year.  Yearly screening should continue until it has been 15 years since you quit.  Yearly screening should stop if you develop a health problem that would prevent you from having lung cancer treatment.  Breast Cancer  Practice breast self-awareness. This means understanding how your breasts normally appear and feel.  It also means doing regular breast self-exams. Let your health care provider know about any changes, no matter how small.  If you are in your 20s or 30s, you should have a clinical breast exam (CBE) by a health care provider every 1-3 years as part of a regular health exam.  If you are 40 or older,  have a  CBE every year. Also consider having a breast X-ray (mammogram) every year.  If you have a family history of breast cancer, talk to your health care provider about genetic screening.  If you are at high risk for breast cancer, talk to your health care provider about having an MRI and a mammogram every year.  Breast cancer gene (BRCA) assessment is recommended for women who have family members with BRCA-related cancers. BRCA-related cancers include:  Breast.  Ovarian.  Tubal.  Peritoneal cancers.  Results of the assessment will determine the need for genetic counseling and BRCA1 and BRCA2 testing. Cervical Cancer Your health care provider may recommend that you be screened regularly for cancer of the pelvic organs (ovaries, uterus, and vagina). This screening involves a pelvic examination, including checking for microscopic changes to the surface of your cervix (Pap test). You may be encouraged to have this screening done every 3 years, beginning at age 5.  For women ages 49-65, health care providers may recommend pelvic exams and Pap testing every 3 years, or they may recommend the Pap and pelvic exam, combined with testing for human papilloma virus (HPV), every 5 years. Some types of HPV increase your risk of cervical cancer. Testing for HPV may also be done on women of any age with unclear Pap test results.  Other health care providers may not recommend any screening for nonpregnant women who are considered low risk for pelvic cancer and who do not have symptoms. Ask your health care provider if a screening pelvic exam is right for you.  If you have had past treatment for cervical cancer or a condition that could lead to cancer, you need Pap tests and screening for cancer for at least 20 years after your treatment. If Pap tests have been discontinued, your risk factors (such as having a new sexual partner) need to be reassessed to determine if screening should resume. Some women have  medical problems that increase the chance of getting cervical cancer. In these cases, your health care provider may recommend more frequent screening and Pap tests. Colorectal Cancer  This type of cancer can be detected and often prevented.  Routine colorectal cancer screening usually begins at 25 years of age and continues through 25 years of age.  Your health care provider may recommend screening at an earlier age if you have risk factors for colon cancer.  Your health care provider may also recommend using home test kits to check for hidden blood in the stool.  A small camera at the end of a tube can be used to examine your colon directly (sigmoidoscopy or colonoscopy). This is done to check for the earliest forms of colorectal cancer.  Routine screening usually begins at age 48.  Direct examination of the colon should be repeated every 5-10 years through 25 years of age. However, you may need to be screened more often if early forms of precancerous polyps or small growths are found. Skin Cancer  Check your skin from head to toe regularly.  Tell your health care provider about any new moles or changes in moles, especially if there is a change in a mole's shape or color.  Also tell your health care provider if you have a mole that is larger than the size of a pencil eraser.  Always use sunscreen. Apply sunscreen liberally and repeatedly throughout the day.  Protect yourself by wearing long sleeves, pants, a wide-brimmed hat, and sunglasses whenever you are outside. HEART DISEASE, DIABETES, AND  HIGH BLOOD PRESSURE   High blood pressure causes heart disease and increases the risk of stroke. High blood pressure is more likely to develop in:  People who have blood pressure in the high end of the normal range (130-139/85-89 mm Hg).  People who are overweight or obese.  People who are African American.  If you are 43-21 years of age, have your blood pressure checked every 3-5 years.  If you are 77 years of age or older, have your blood pressure checked every year. You should have your blood pressure measured twice--once when you are at a hospital or clinic, and once when you are not at a hospital or clinic. Record the average of the two measurements. To check your blood pressure when you are not at a hospital or clinic, you can use:  An automated blood pressure machine at a pharmacy.  A home blood pressure monitor.  If you are between 68 years and 97 years old, ask your health care provider if you should take aspirin to prevent strokes.  Have regular diabetes screenings. This involves taking a blood sample to check your fasting blood sugar level.  If you are at a normal weight and have a low risk for diabetes, have this test once every three years after 25 years of age.  If you are overweight and have a high risk for diabetes, consider being tested at a younger age or more often. PREVENTING INFECTION  Hepatitis B  If you have a higher risk for hepatitis B, you should be screened for this virus. You are considered at high risk for hepatitis B if:  You were born in a country where hepatitis B is common. Ask your health care provider which countries are considered high risk.  Your parents were born in a high-risk country, and you have not been immunized against hepatitis B (hepatitis B vaccine).  You have HIV or AIDS.  You use needles to inject street drugs.  You live with someone who has hepatitis B.  You have had sex with someone who has hepatitis B.  You get hemodialysis treatment.  You take certain medicines for conditions, including cancer, organ transplantation, and autoimmune conditions. Hepatitis C  Blood testing is recommended for:  Everyone born from [birth year] through 1966.  Anyone with known risk factors for hepatitis C. Sexually transmitted infections (STIs)  You should be screened for sexually transmitted infections (STIs) including gonorrhea and  chlamydia if:  You are sexually active and are younger than 25 years of age.  You are older than 25 years of age and your health care provider tells you that you are at risk for this type of infection.  Your sexual activity has changed since you were last screened and you are at an increased risk for chlamydia or gonorrhea. Ask your health care provider if you are at risk.  If you do not have HIV, but are at risk, it may be recommended that you take a prescription medicine daily to prevent HIV infection. This is called pre-exposure prophylaxis (PrEP). You are considered at risk if:  You are sexually active and do not regularly use condoms or know the HIV status of your partner(s).  You take drugs by injection.  You are sexually active with a partner who has HIV. Talk with your health care provider about whether you are at high risk of being infected with HIV. If you choose to begin PrEP, you should first be tested for HIV. You should then be tested  every 3 months for as long as you are taking PrEP.  PREGNANCY   If you are premenopausal and you may become pregnant, ask your health care provider about preconception counseling.  If you may become pregnant, take 400 to 800 micrograms (mcg) of folic acid every day.  If you want to prevent pregnancy, talk to your health care provider about birth control (contraception). OSTEOPOROSIS AND MENOPAUSE   Osteoporosis is a disease in which the bones lose minerals and strength with aging. This can result in serious bone fractures. Your risk for osteoporosis can be identified using a bone density scan.  If you are 73 years of age or older, or if you are at risk for osteoporosis and fractures, ask your health care provider if you should be screened.  Ask your health care provider whether you should take a calcium or vitamin D supplement to lower your risk for osteoporosis.  Menopause may have certain physical symptoms and risks.  Hormone replacement  therapy may reduce some of these symptoms and risks. Talk to your health care provider about whether hormone replacement therapy is right for you.  HOME CARE INSTRUCTIONS   Schedule regular health, dental, and eye exams.  Stay current with your immunizations.   Do not use any tobacco products including cigarettes, chewing tobacco, or electronic cigarettes.  If you are pregnant, do not drink alcohol.  If you are breastfeeding, limit how much and how often you drink alcohol.  Limit alcohol intake to no more than 1 drink per day for nonpregnant women. One drink equals 12 ounces of beer, 5 ounces of wine, or 1 ounces of hard liquor.  Do not use street drugs.  Do not share needles.  Ask your health care provider for help if you need support or information about quitting drugs.  Tell your health care provider if you often feel depressed.  Tell your health care provider if you have ever been abused or do not feel safe at home.   This information is not intended to replace advice given to you by your health care provider. Make sure you discuss any questions you have with your health care provider.   Document Released: 03/07/2011 Document Revised: 09/12/2014 Document Reviewed: 07/24/2013 Elsevier Interactive Patient Education 2016 [place] K. Ninel Abdella M.D.

## 2015-10-19 ENCOUNTER — Other Ambulatory Visit

## 2015-11-02 ENCOUNTER — Other Ambulatory Visit: Payer: Self-pay | Admitting: Internal Medicine

## 2015-11-02 NOTE — Telephone Encounter (Signed)
Sent to the pharmacy by e-scribe. 

## 2015-11-26 ENCOUNTER — Telehealth: Payer: Self-pay | Admitting: Psychology

## 2015-11-26 NOTE — Telephone Encounter (Signed)
Patient called to request beh med appointment.  She is not a patient at the [facility].  Offered to help her find someone else.  She reported she had a list of providers and would call one of them.

## 2015-12-22 ENCOUNTER — Other Ambulatory Visit: Payer: Self-pay | Admitting: Nurse Practitioner

## 2015-12-22 ENCOUNTER — Other Ambulatory Visit: Payer: Self-pay | Admitting: Internal Medicine

## 2015-12-22 ENCOUNTER — Other Ambulatory Visit (HOSPITAL_COMMUNITY)
Admission: RE | Admit: 2015-12-22 | Discharge: 2015-12-22 | Disposition: A | Discharge status: Home / Self Care | Source: Ambulatory Visit | Attending: Obstetrics and Gynecology | Admitting: Obstetrics and Gynecology

## 2015-12-22 DIAGNOSIS — Z01419 Encounter for gynecological examination (general) (routine) without abnormal findings: Secondary | ICD-10-CM | POA: Insufficient documentation

## 2015-12-23 ENCOUNTER — Telehealth: Payer: Self-pay | Admitting: Family Medicine

## 2015-12-23 LAB — CYTOLOGY - PAP

## 2015-12-23 NOTE — Telephone Encounter (Signed)
lmom for pt to call back

## 2015-12-23 NOTE — Telephone Encounter (Signed)
Sent to the pharmacy for 90 days.  Pt past due for lab work.  Message sent to scheduling.

## 2015-12-23 NOTE — Telephone Encounter (Signed)
Pt due for lab work in Feb.  Please help her to make that appt  ASAP.  Does not need to fast.

## 2015-12-25 NOTE — Telephone Encounter (Signed)
lmom for pt to call back

## 2015-12-29 NOTE — Telephone Encounter (Signed)
Pt has new ins and unable to see dr Fabian Sharppanosh

## 2016-05-18 ENCOUNTER — Telehealth: Payer: Self-pay | Admitting: Internal Medicine

## 2016-05-18 NOTE — Telephone Encounter (Signed)
Pt did not have follow up lab work and has not been seen for follow up.  Please advise.  Thanks!!!

## 2016-05-18 NOTE — Telephone Encounter (Signed)
Pt need new Rx for SYNTHROID and sertraline    [facility] [place]     Pt last seen 08/18/15 made pt aware that she may be required to have a appointment before refill is done.  She state that she does not have insurance and can not afford a office visit and has not found a doctor yet.

## 2016-05-18 NOTE — Telephone Encounter (Signed)
dont want her to run out   Refill for 90 days    Ask her if she is taking med every day      Or how often she is taking  Get tsh   Level in the interim and OV  With results    Lab Results  Component Value Date   WBC 4.2 07/23/2015   HGB 12.4 07/23/2015   HCT 38.4 07/23/2015   PLT 280.0 07/23/2015   GLUCOSE 81 07/23/2015   CHOL 195 07/23/2015   TRIG 75.0 07/23/2015   HDL 56.30 07/23/2015   LDLCALC 124 (H) 07/23/2015   ALT 13 07/23/2015   AST 19 07/23/2015   NA 139 07/23/2015   K 5.2 (H) 07/23/2015   CL 106 07/23/2015   CREATININE 0.99 07/23/2015   BUN 12 07/23/2015   CO2 26 07/23/2015   TSH 66.64 (H) 07/23/2015

## 2016-05-19 NOTE — Telephone Encounter (Signed)
Tried to reach the pt.  Received a message that her mailbox is full.  Unable to leave a message.  Will try again at a later time.

## 2016-05-19 NOTE — Telephone Encounter (Signed)
Pt returned your call.  

## 2016-05-20 MED ORDER — LEVOTHYROXINE SODIUM 150 MCG PO TABS: 150.0000 ug | ORAL_TABLET | Freq: Every day | ORAL | 0 refills | Status: AC

## 2016-05-20 NOTE — Telephone Encounter (Signed)
Sent to the pharmacy for 90 days.  Spoke to the pt and informed her that she will need lab work and follow up.  Will check her schedule and call back.

## 2016-05-22 ENCOUNTER — Other Ambulatory Visit: Payer: Self-pay | Admitting: Internal Medicine

## 2016-05-23 NOTE — Telephone Encounter (Signed)
Pt states the sertraline (ZOLOFT) 50 MG tablet Was not sent to the pharmacy.   Pt would like to know if you can resend.   Pt states dr Fabian Sharppanosh is not in her network anymore and she will be moving out of state soon. Pt does not have a dr right now but hopes to get med. About to run out.  [facility]

## 2016-05-25 NOTE — Telephone Encounter (Signed)
Pt following up on refill request.  Please advise  Pt is out of her med now.

## 2016-05-26 NOTE — Telephone Encounter (Signed)
Sent in

## 2016-06-15 IMAGING — CR DG THORACIC SPINE 2V
2 series · 2 of 2 positions shown · non-contrast
Comparison: None.

CLINICAL DATA: Lifting injury to the thoracic spine with upper
thoracic pain. Thoracic fusion. Initial encounter.

EXAM:
THORACIC SPINE 2 VIEWS

[AP]
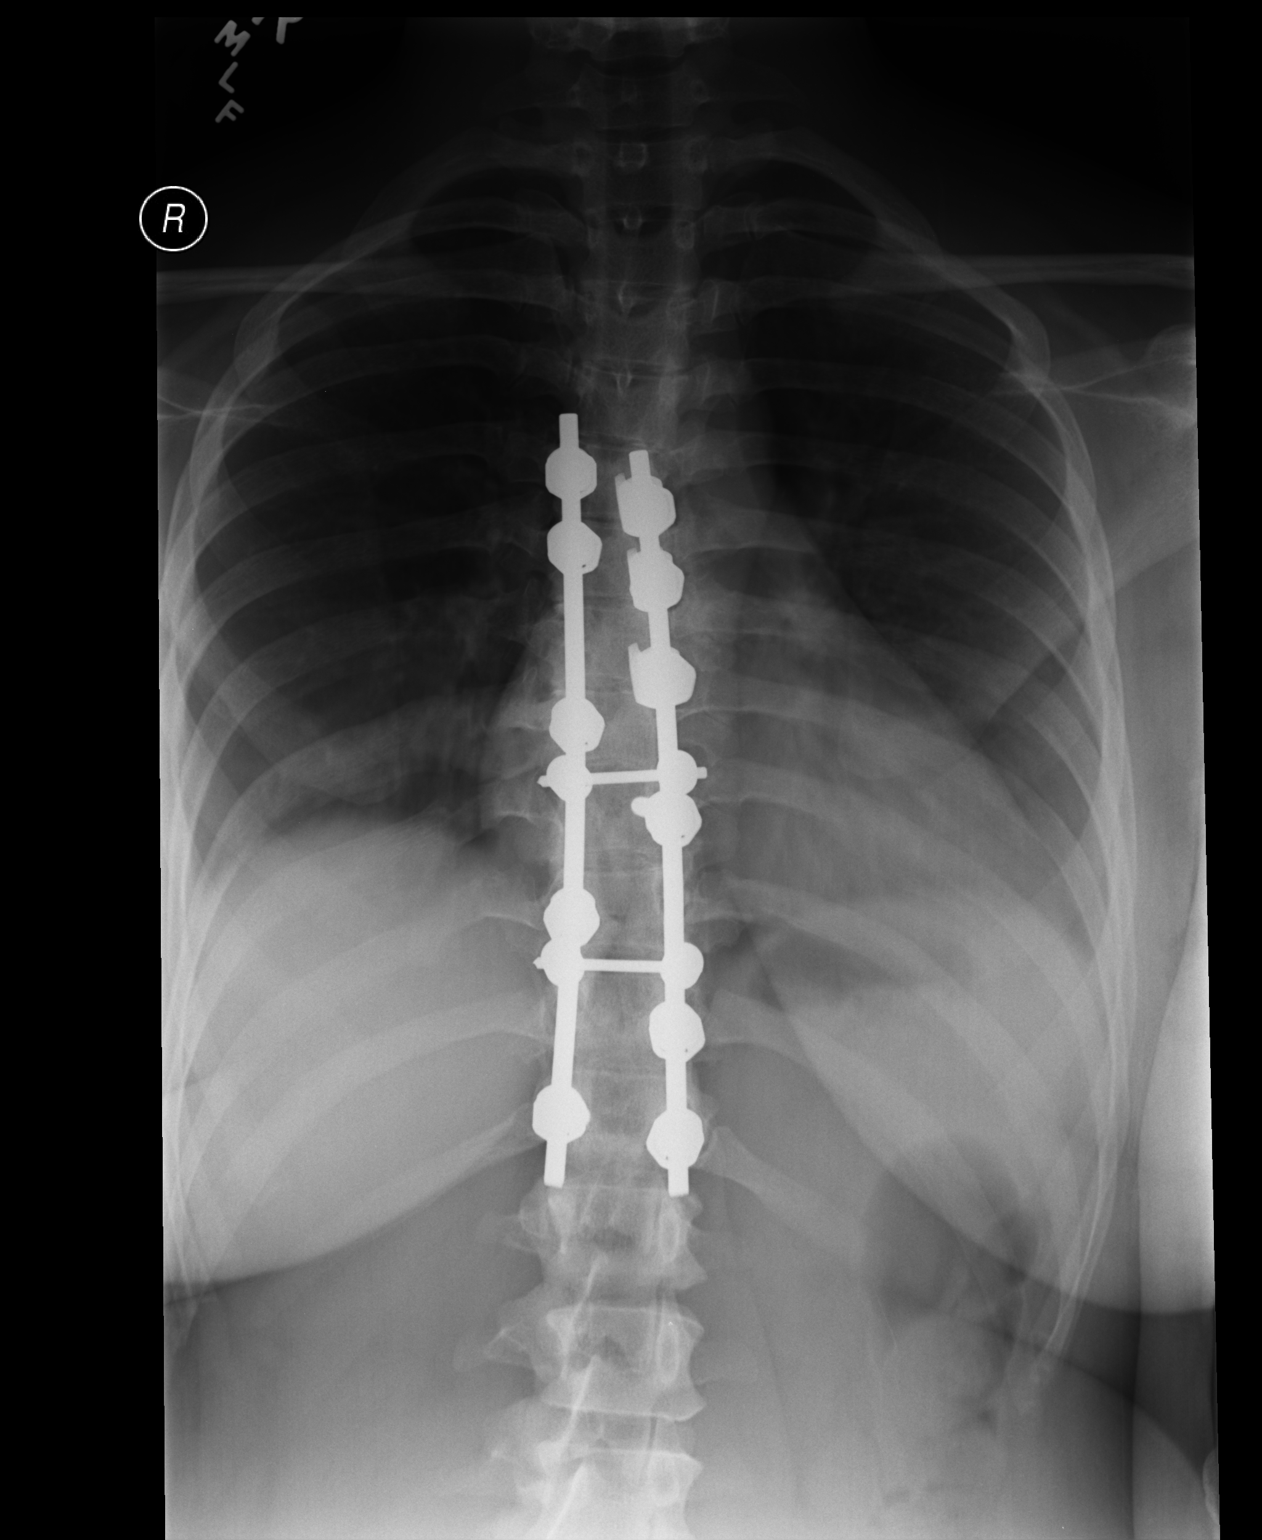

[lateral]
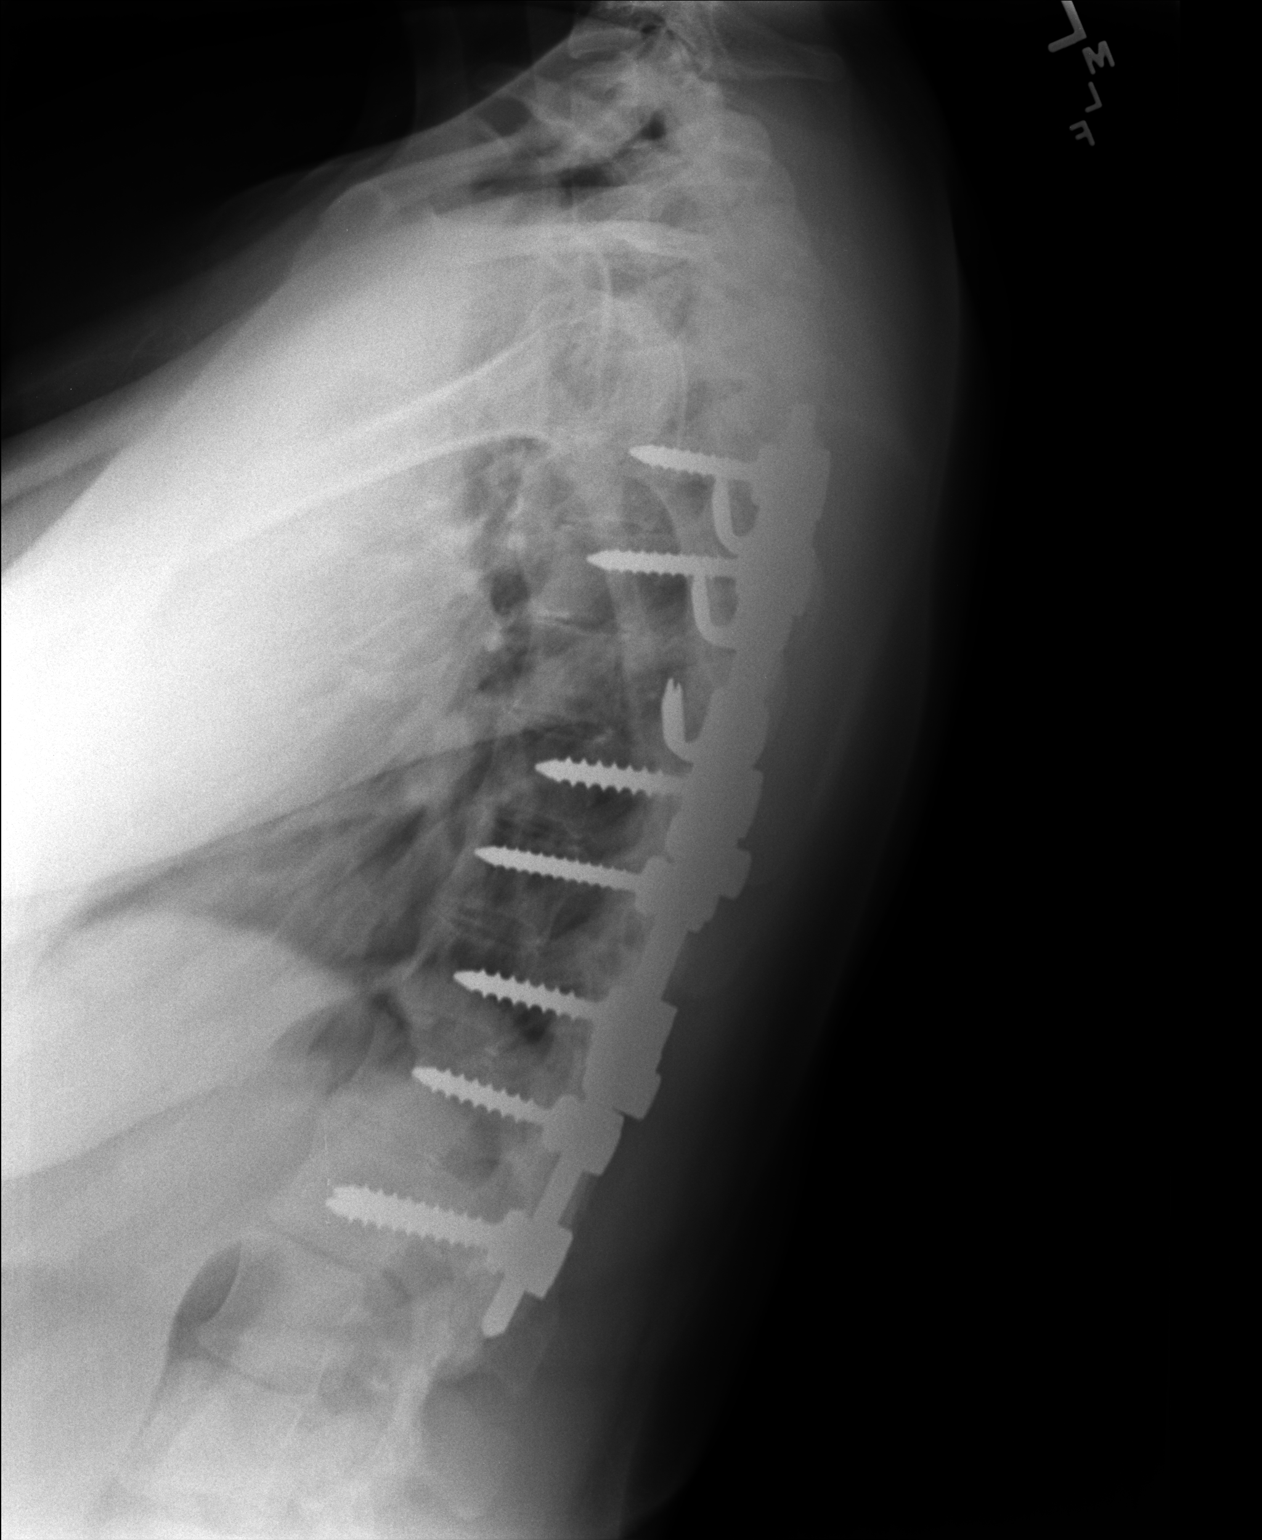

[2 of 2 positions shown; findings below may reference images not displayed]

FINDINGS: Posterior rod and pedicular screw fixation from T5-T12 identified.

There is no evidence of acute fracture or subluxation.

The disc spaces are maintained.

No hardware complicating features are noted.
IMPRESSION: No acute abnormalities.

Thoracic surgical hardware without definite complicating features.

## 2016-06-15 IMAGING — CR DG LUMBAR SPINE COMPLETE 4+V
4 series · 4 of 4 positions shown · non-contrast
Comparison: None.

CLINICAL DATA: Back injury while lifting at work. Initial
encounter.

EXAM:
LUMBAR SPINE - COMPLETE 4+ VIEW

[AP (1 of 2)]
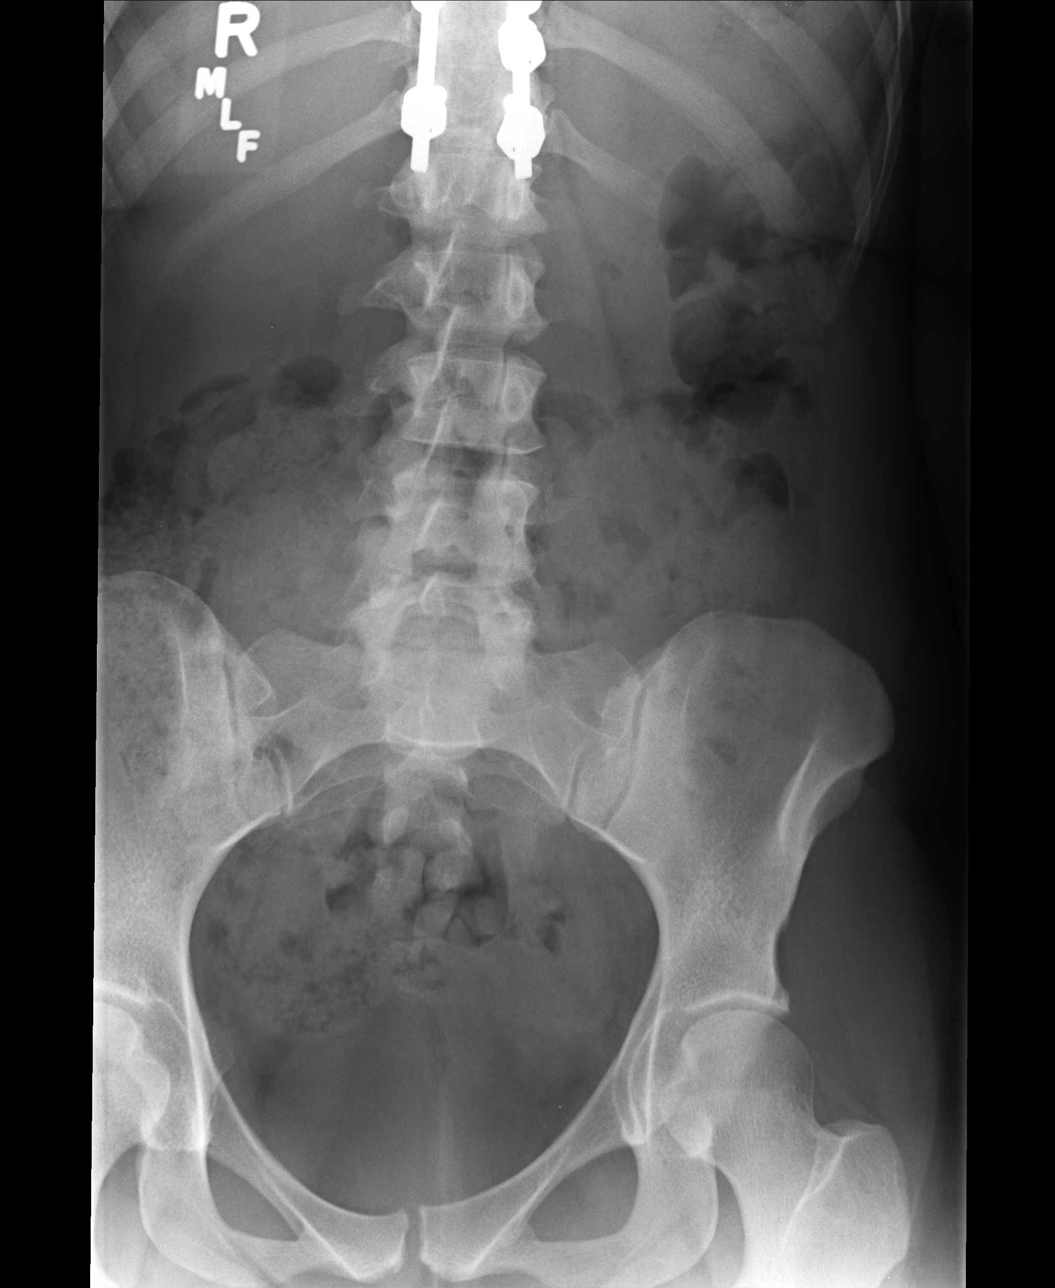

[rpo]
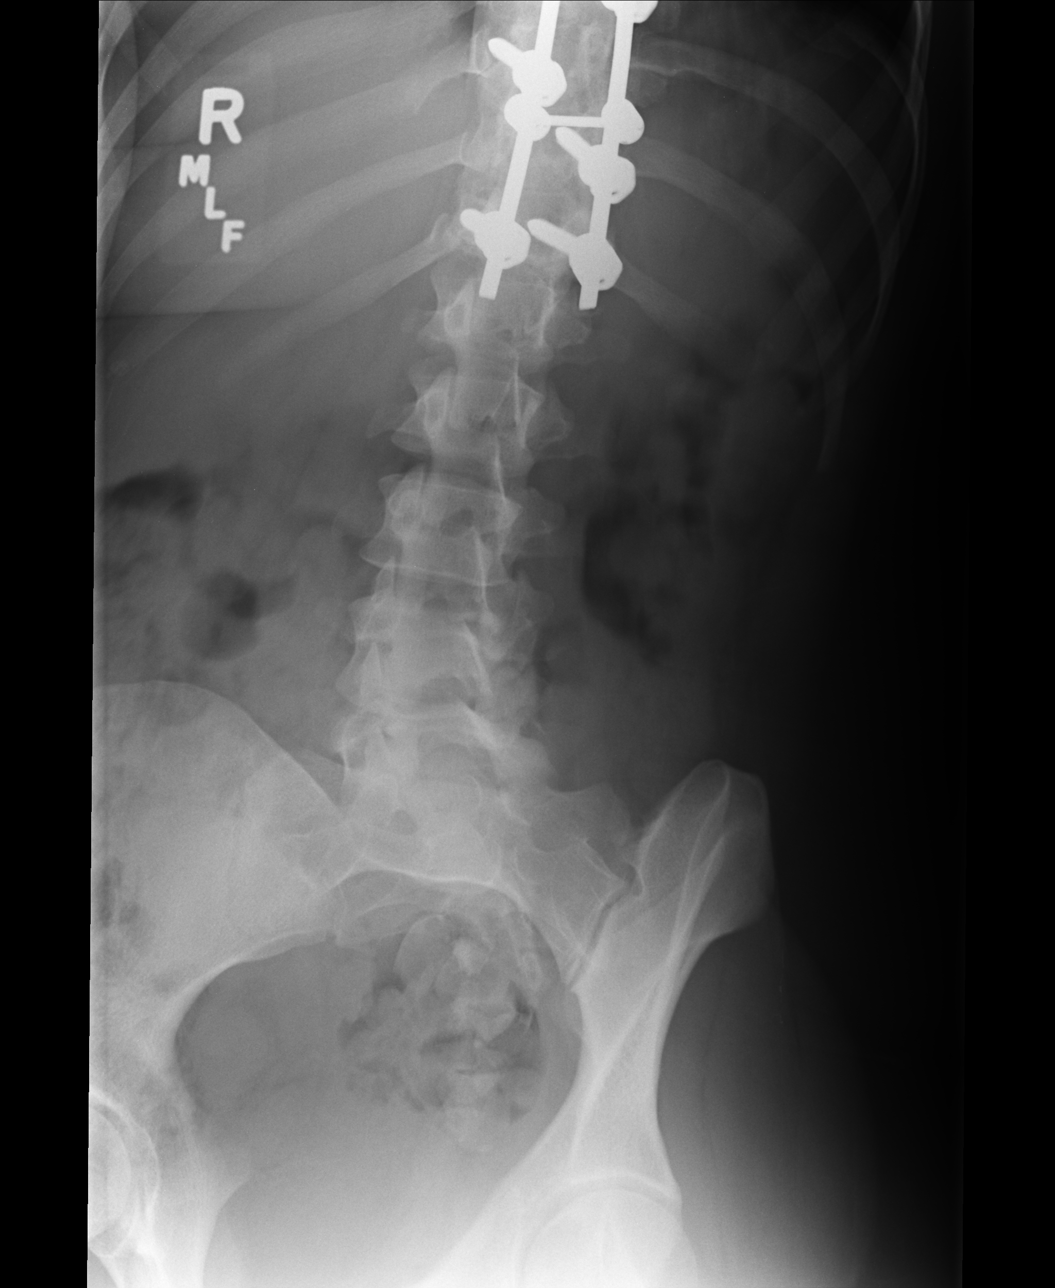

[lpo]
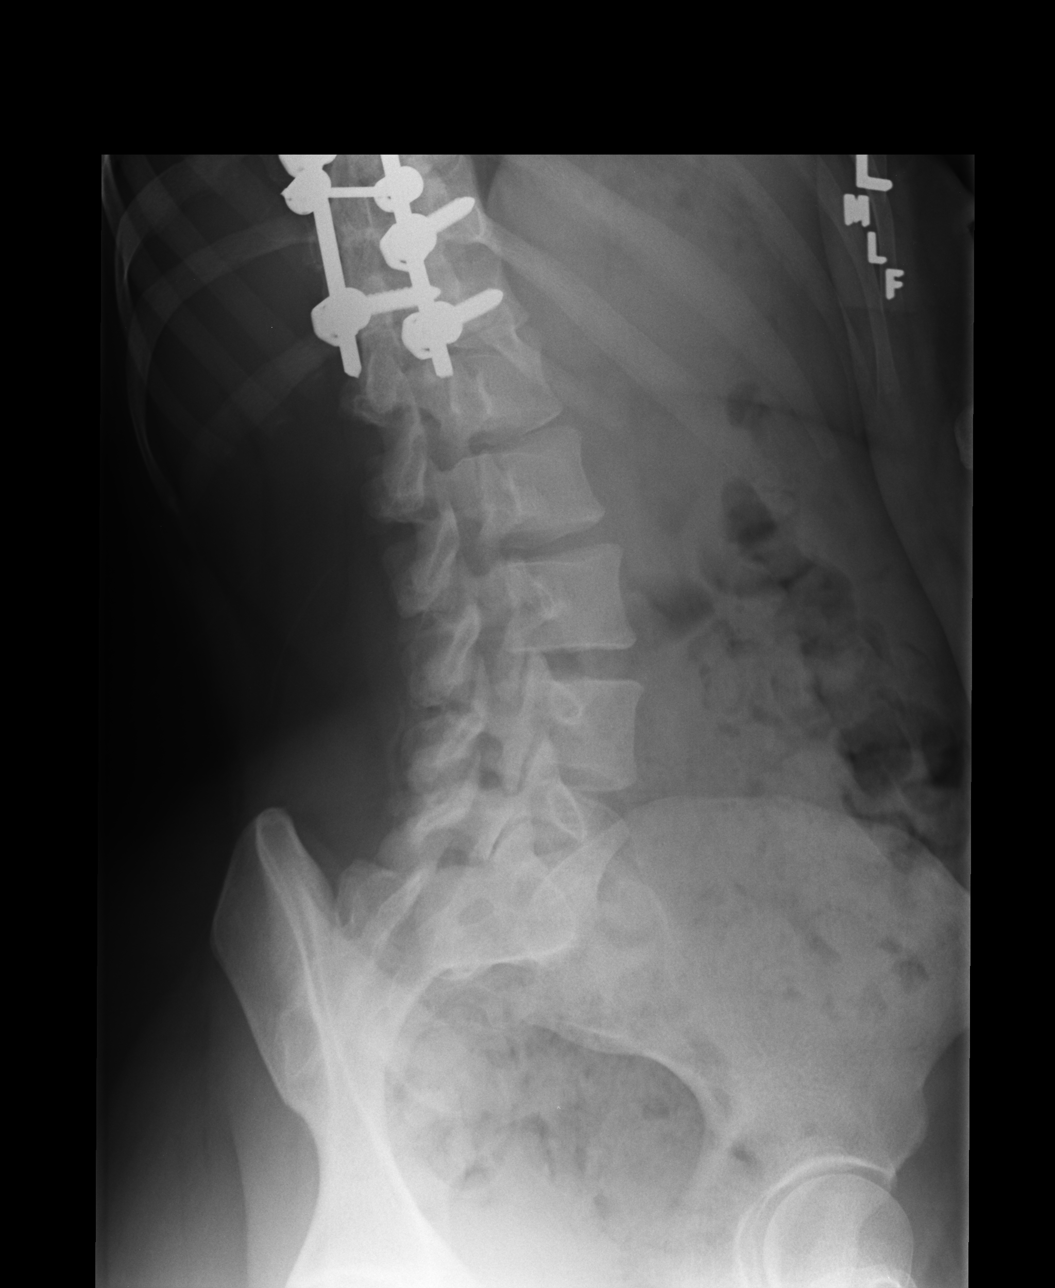

[AP (2 of 2)]
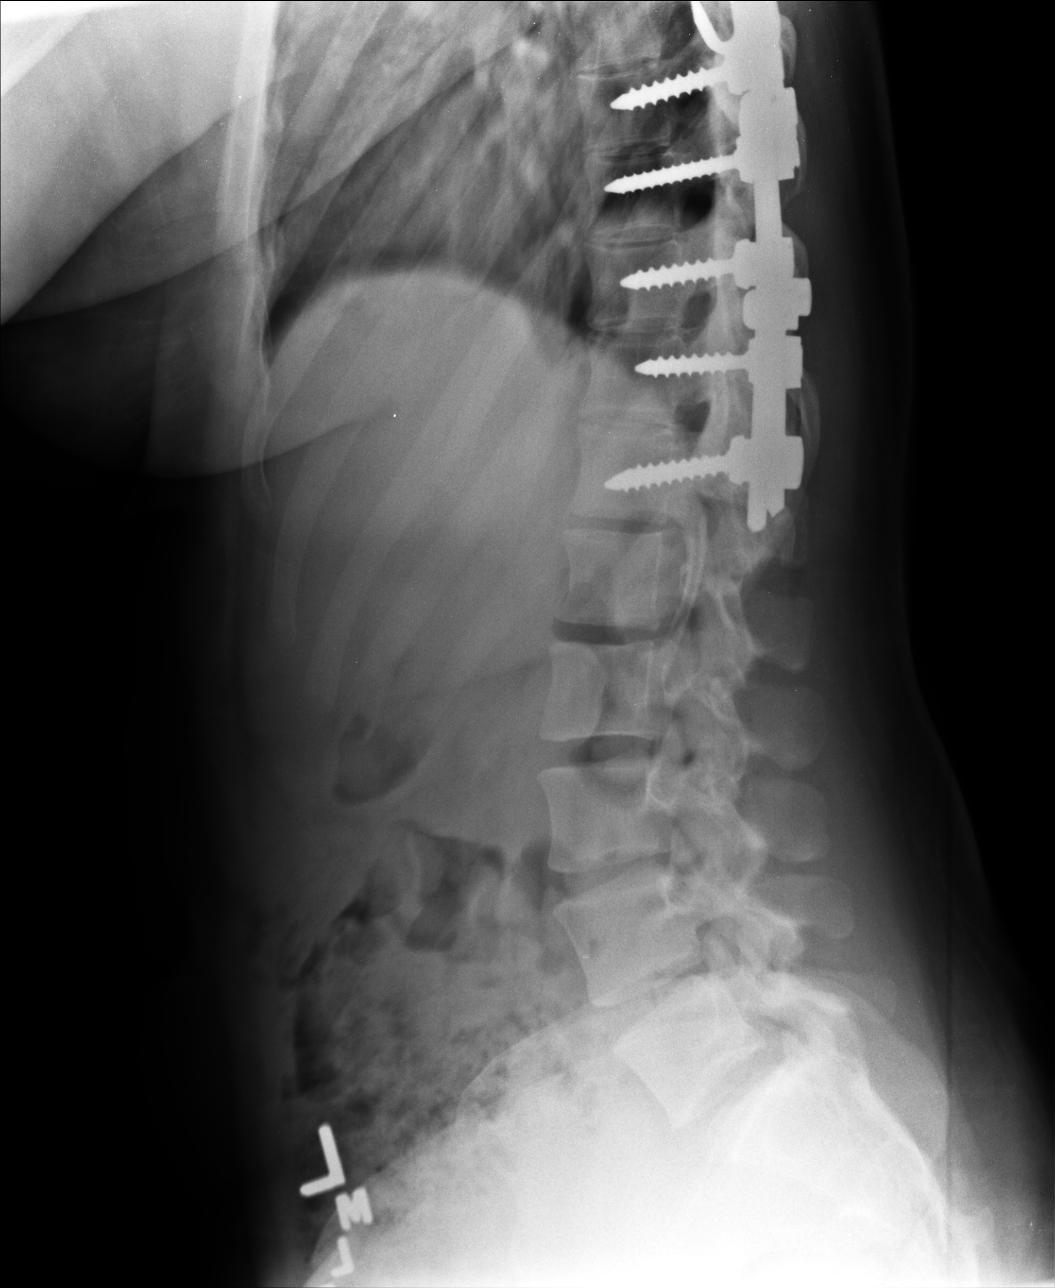

[4 of 4 positions shown; findings below may reference images not displayed]

FINDINGS: Thoracic spinal hardware, incompletely visualized. Normal anatomic
alignment of the lumbar spine. Preservation of the vertebral body
and intervertebral disc space heights. No evidence for acute
displaced lumbar spine fracture. SI joints are unremarkable. Stool
throughout the colon.
IMPRESSION: No acute osseous abnormality. Lower thoracic spinal fusion hardware.

Stool throughout the colon as can be seen with constipation.

## 2017-05-26 ENCOUNTER — Encounter: Payer: Self-pay | Admitting: Internal Medicine
